# Patient Record
Sex: Male | Born: 1935 | Race: White | Hispanic: No | Marital: Married
Health system: Southern US, Community
[De-identification: ages and names within clinical notes are randomized; demographics above are authoritative.]

---

## 2004-09-19 ENCOUNTER — Inpatient Hospital Stay: Payer: Self-pay | Admitting: Internal Medicine

## 2004-09-19 ENCOUNTER — Other Ambulatory Visit: Payer: Self-pay

## 2004-09-20 ENCOUNTER — Other Ambulatory Visit: Payer: Self-pay

## 2004-09-21 ENCOUNTER — Other Ambulatory Visit: Payer: Self-pay

## 2005-02-04 ENCOUNTER — Emergency Department: Payer: Self-pay | Admitting: Emergency Medicine

## 2005-05-02 ENCOUNTER — Other Ambulatory Visit: Payer: Self-pay

## 2005-05-03 ENCOUNTER — Other Ambulatory Visit: Payer: Self-pay

## 2005-05-03 ENCOUNTER — Inpatient Hospital Stay: Payer: Self-pay | Admitting: Internal Medicine

## 2005-05-04 ENCOUNTER — Other Ambulatory Visit: Payer: Self-pay

## 2005-06-12 ENCOUNTER — Encounter: Payer: Self-pay | Admitting: Orthopedic Surgery

## 2005-06-23 ENCOUNTER — Encounter: Payer: Self-pay | Admitting: Orthopedic Surgery

## 2007-08-13 ENCOUNTER — Ambulatory Visit: Payer: Self-pay | Admitting: Family Medicine

## 2007-09-23 ENCOUNTER — Ambulatory Visit: Payer: Self-pay | Admitting: Family Medicine

## 2007-09-24 ENCOUNTER — Ambulatory Visit: Payer: Self-pay | Admitting: Family Medicine

## 2008-09-28 ENCOUNTER — Ambulatory Visit: Payer: Self-pay | Admitting: Family Medicine

## 2009-01-28 ENCOUNTER — Emergency Department: Payer: Self-pay | Admitting: Emergency Medicine

## 2009-12-14 ENCOUNTER — Ambulatory Visit: Payer: Self-pay | Admitting: Internal Medicine

## 2010-03-22 ENCOUNTER — Ambulatory Visit: Payer: Self-pay | Admitting: Family Medicine

## 2010-04-06 ENCOUNTER — Ambulatory Visit: Payer: Self-pay | Admitting: Family Medicine

## 2010-10-21 ENCOUNTER — Ambulatory Visit: Payer: Self-pay | Admitting: Family Medicine

## 2010-10-23 ENCOUNTER — Emergency Department: Payer: Self-pay | Admitting: Emergency Medicine

## 2011-01-28 ENCOUNTER — Ambulatory Visit: Payer: Self-pay | Admitting: Family Medicine

## 2011-04-12 ENCOUNTER — Ambulatory Visit: Payer: Self-pay | Admitting: Family Medicine

## 2011-04-15 ENCOUNTER — Inpatient Hospital Stay: Payer: Self-pay | Admitting: Internal Medicine

## 2011-04-30 ENCOUNTER — Emergency Department: Payer: Self-pay | Admitting: Internal Medicine

## 2011-05-02 DIAGNOSIS — E785 Hyperlipidemia, unspecified: Secondary | ICD-10-CM

## 2011-05-02 DIAGNOSIS — F329 Major depressive disorder, single episode, unspecified: Secondary | ICD-10-CM

## 2011-05-02 DIAGNOSIS — F0150 Vascular dementia without behavioral disturbance: Secondary | ICD-10-CM

## 2011-05-02 DIAGNOSIS — I10 Essential (primary) hypertension: Secondary | ICD-10-CM

## 2011-05-09 DIAGNOSIS — IMO0001 Reserved for inherently not codable concepts without codable children: Secondary | ICD-10-CM

## 2011-05-09 DIAGNOSIS — F609 Personality disorder, unspecified: Secondary | ICD-10-CM

## 2011-05-12 DIAGNOSIS — Z9181 History of falling: Secondary | ICD-10-CM

## 2011-06-05 DIAGNOSIS — M810 Age-related osteoporosis without current pathological fracture: Secondary | ICD-10-CM

## 2011-06-05 DIAGNOSIS — F068 Other specified mental disorders due to known physiological condition: Secondary | ICD-10-CM

## 2011-06-05 DIAGNOSIS — F3289 Other specified depressive episodes: Secondary | ICD-10-CM

## 2011-06-05 DIAGNOSIS — I10 Essential (primary) hypertension: Secondary | ICD-10-CM

## 2011-06-05 DIAGNOSIS — F329 Major depressive disorder, single episode, unspecified: Secondary | ICD-10-CM

## 2011-06-13 DIAGNOSIS — M25579 Pain in unspecified ankle and joints of unspecified foot: Secondary | ICD-10-CM

## 2011-07-20 DIAGNOSIS — F329 Major depressive disorder, single episode, unspecified: Secondary | ICD-10-CM

## 2011-07-20 DIAGNOSIS — I10 Essential (primary) hypertension: Secondary | ICD-10-CM

## 2011-07-20 DIAGNOSIS — M159 Polyosteoarthritis, unspecified: Secondary | ICD-10-CM

## 2011-07-20 DIAGNOSIS — F068 Other specified mental disorders due to known physiological condition: Secondary | ICD-10-CM

## 2011-07-25 DIAGNOSIS — H9190 Unspecified hearing loss, unspecified ear: Secondary | ICD-10-CM

## 2011-07-31 DIAGNOSIS — R4182 Altered mental status, unspecified: Secondary | ICD-10-CM

## 2011-08-01 ENCOUNTER — Ambulatory Visit: Payer: Self-pay | Admitting: Internal Medicine

## 2011-08-08 DIAGNOSIS — S20219A Contusion of unspecified front wall of thorax, initial encounter: Secondary | ICD-10-CM

## 2011-08-11 DIAGNOSIS — G4752 REM sleep behavior disorder: Secondary | ICD-10-CM

## 2011-10-11 IMAGING — CR DG CHEST 2V
1 series · 3 of 3 positions shown · non-contrast
Comparison: none

REASON FOR EXAM: bronchitis, Urbain, cough
COMMENTS:

[Series 1: view not recorded · 0.17mm/px · 3 of 3 slices shown]
[im 1/3]
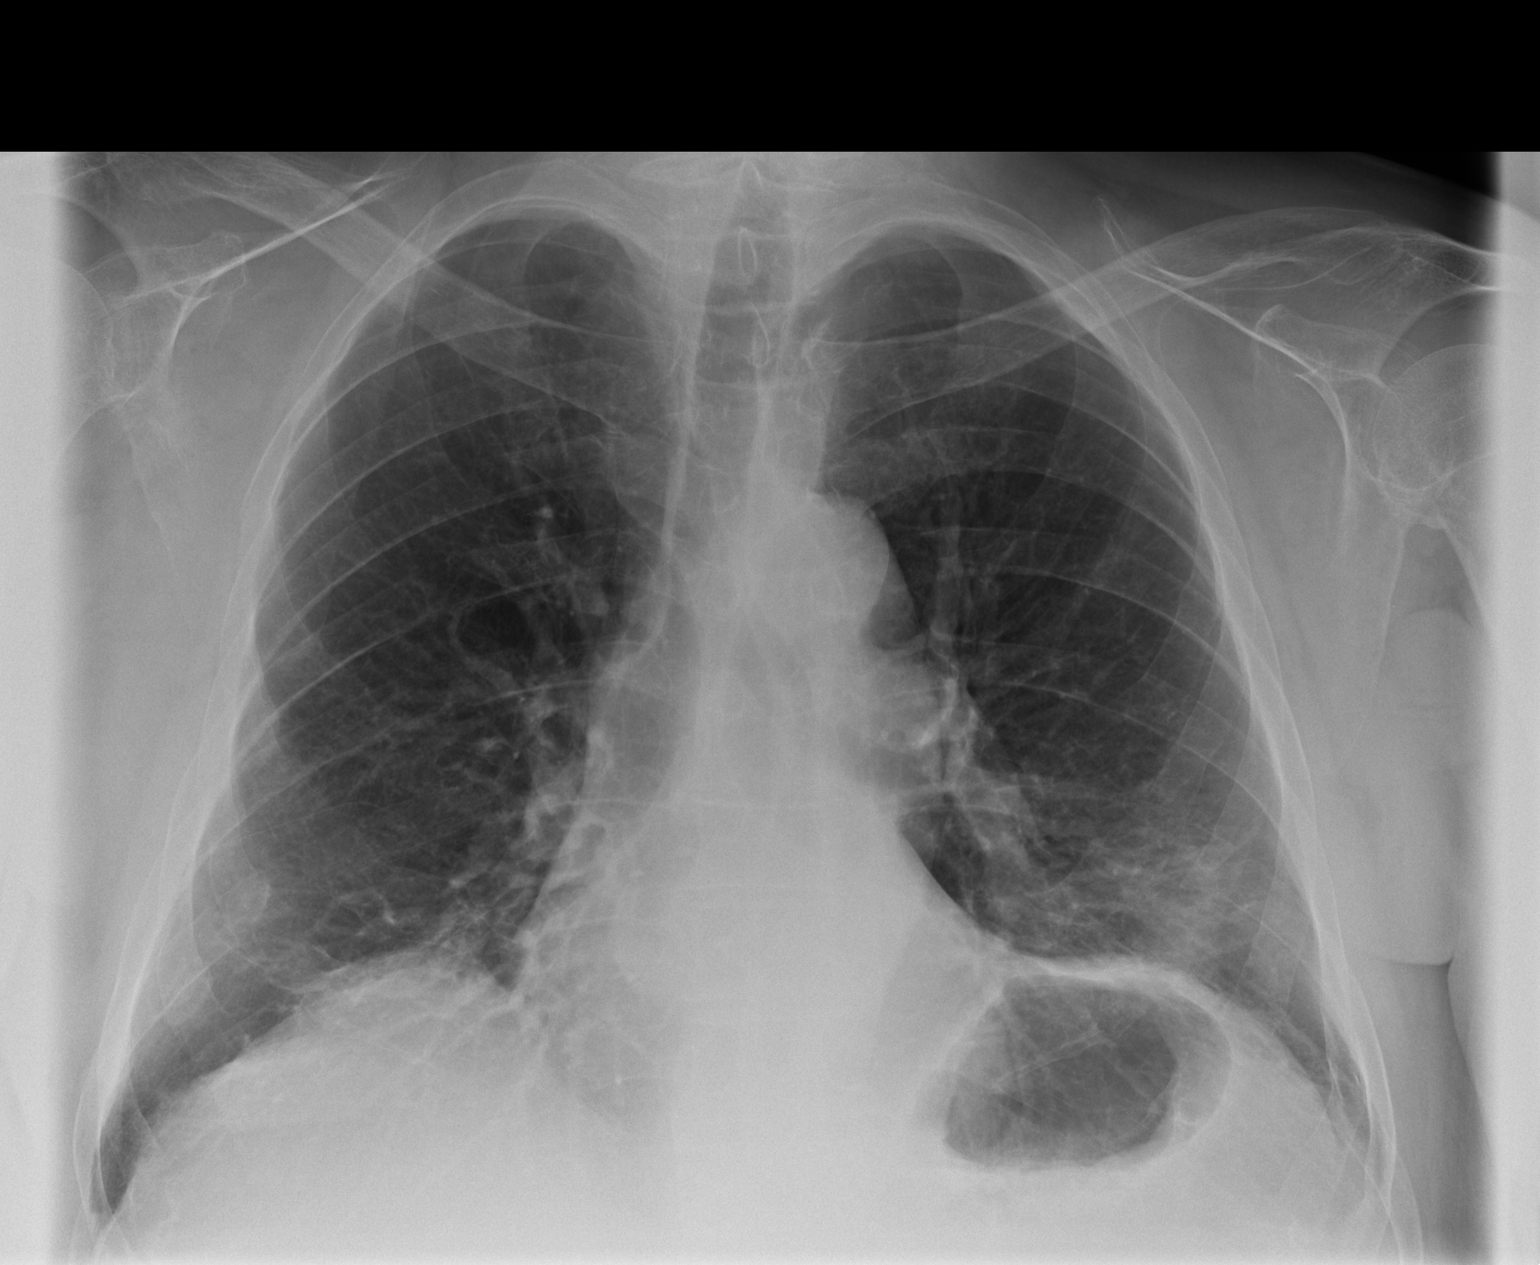
[im 2/3]
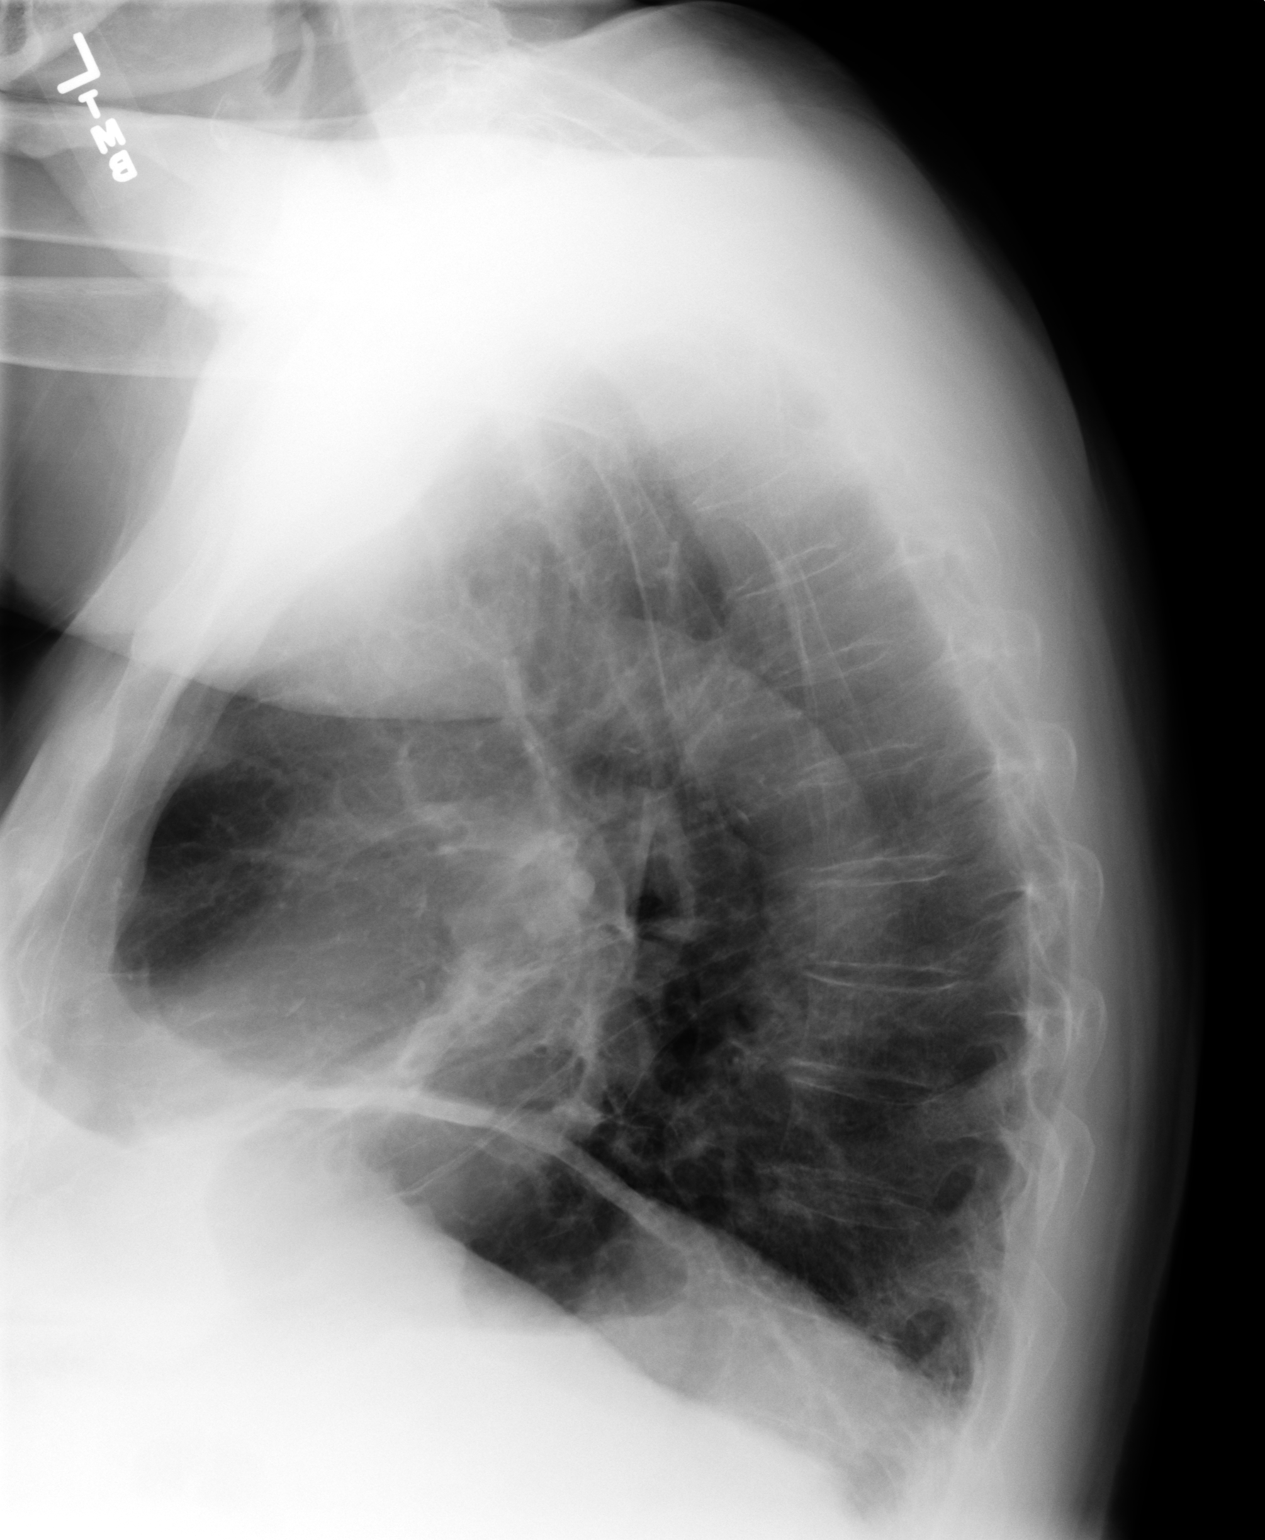
[im 3/3]
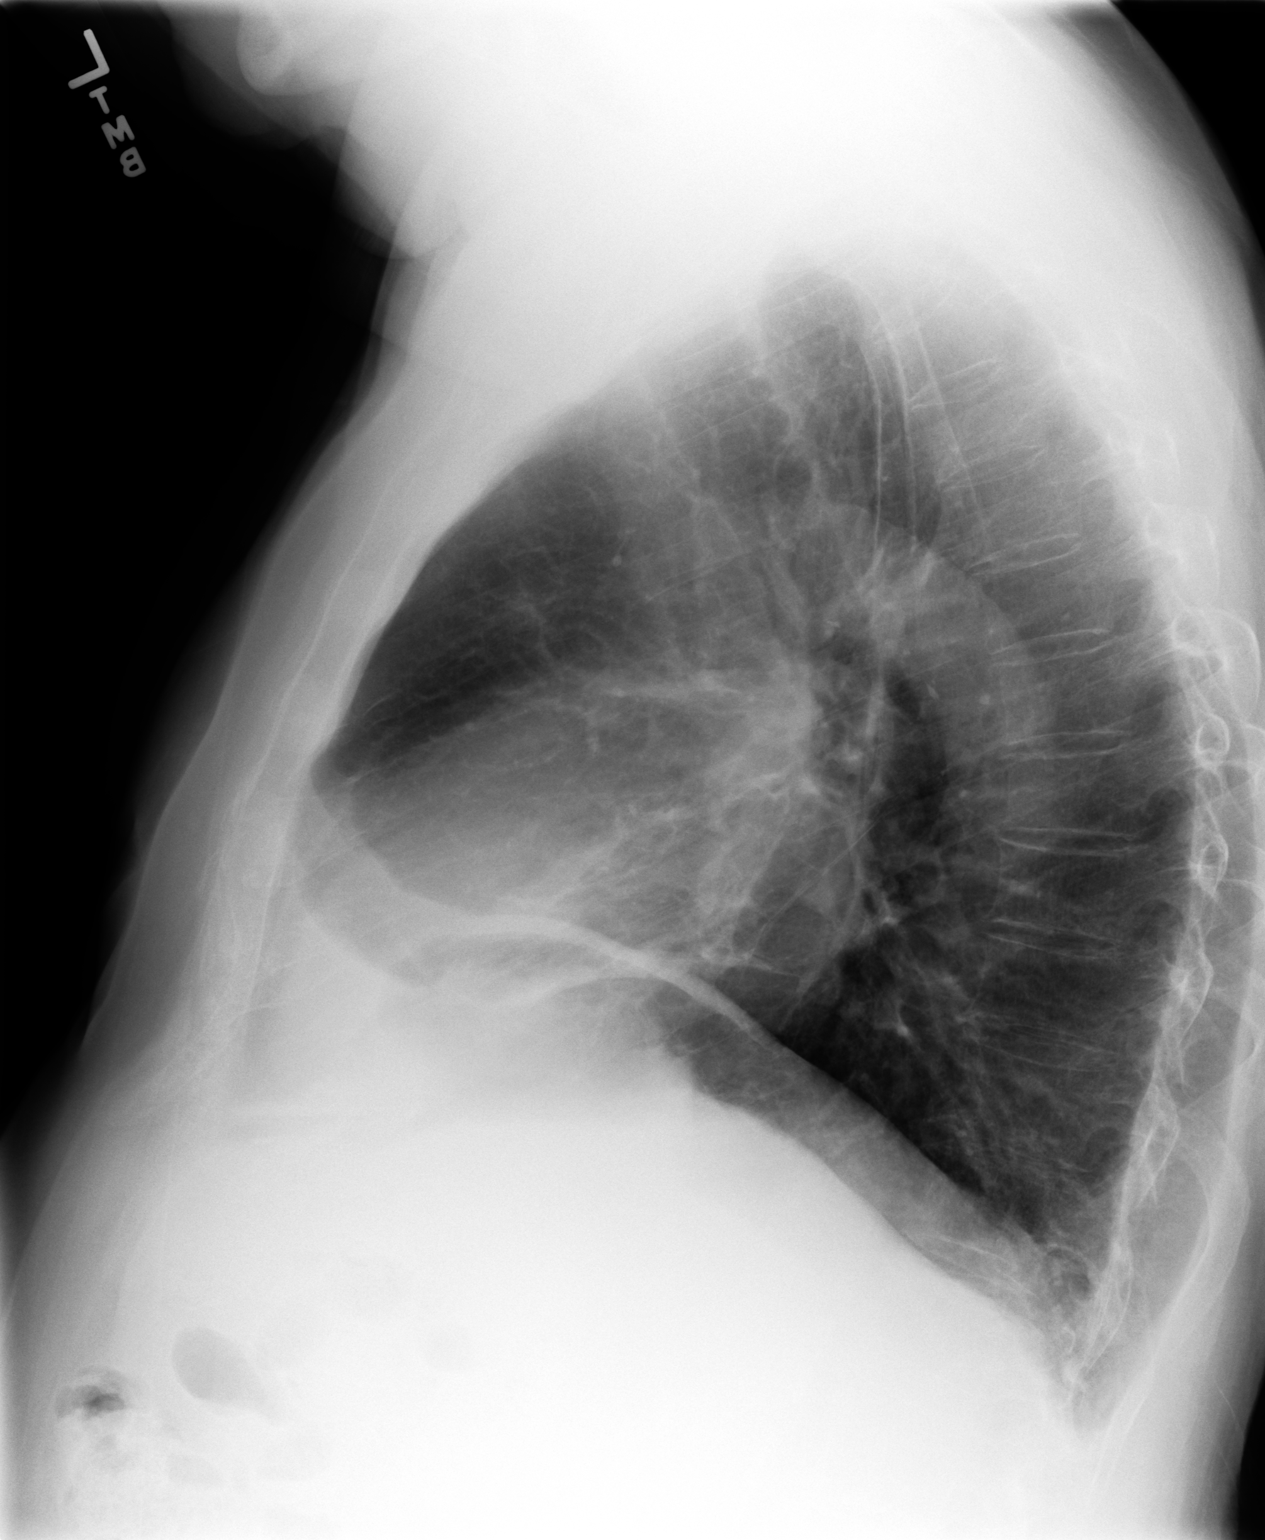

[3 of 3 positions shown; findings below may reference images not displayed]

PROCEDURE:     MDR - MDR CHEST PA(OR AP) AND LATERAL  - March 22, 2010  [DATE]

RESULT:     Comparison is made to the prior exam of 09/23/2007. The current
exam shows a patchy increase in density at the left base. It is uncertain as
to whether the change is secondary to pneumonia, atelectasis or fibrosis.
The lung fields otherwise are clear. Heart size is normal. There are
multiple old rib fracture deformities on the right.
IMPRESSION: 1. There is a patchy area of increased density at the left base. Pneumonia
cannot be excluded but the change also could be secondary to atelectasis or
to fibrosis.
2. Multiple old rib fracture deformities are noted on the right.
3. Heart size is normal.

## 2011-10-20 DIAGNOSIS — Z8673 Personal history of transient ischemic attack (TIA), and cerebral infarction without residual deficits: Secondary | ICD-10-CM

## 2011-10-20 DIAGNOSIS — R413 Other amnesia: Secondary | ICD-10-CM

## 2011-10-20 DIAGNOSIS — M545 Low back pain: Secondary | ICD-10-CM

## 2011-11-13 DIAGNOSIS — IMO0002 Reserved for concepts with insufficient information to code with codable children: Secondary | ICD-10-CM

## 2011-11-17 DIAGNOSIS — F39 Unspecified mood [affective] disorder: Secondary | ICD-10-CM

## 2011-11-24 DIAGNOSIS — I10 Essential (primary) hypertension: Secondary | ICD-10-CM

## 2011-11-24 DIAGNOSIS — S79929A Unspecified injury of unspecified thigh, initial encounter: Secondary | ICD-10-CM

## 2011-11-24 DIAGNOSIS — M818 Other osteoporosis without current pathological fracture: Secondary | ICD-10-CM

## 2011-11-24 DIAGNOSIS — R413 Other amnesia: Secondary | ICD-10-CM

## 2011-11-24 DIAGNOSIS — S79919A Unspecified injury of unspecified hip, initial encounter: Secondary | ICD-10-CM

## 2011-12-11 ENCOUNTER — Emergency Department: Payer: Self-pay | Admitting: Emergency Medicine

## 2011-12-12 ENCOUNTER — Telehealth: Payer: Self-pay | Admitting: Internal Medicine

## 2011-12-12 NOTE — Telephone Encounter (Signed)
Triage Record Num: [id number] Operator: Estevan Oaks Patient Name: Nathan [name] Call Date & Time: 12/11/2011 7:28:38PM Patient Phone: [phone] PCP: Tillman Abide Patient Gender: Male PCP Fax : [phone] Patient DOB: 1923-04-17 Practice Name[facility] Reason for Call: Caller: Deloris/LPN; PCP: Tillman Abide I.; CB#: [phone]; Call regarding need to send to ED for fall. Fell when he went to bathroom. Having shoulder pain. He insists on going to ED. No swelling, no bruising. They spoke with his daughter and she also asks to send him for xray of shoulder. No mobile xray capability at this facility Deloris says. Shoulder Injury, all ER sx's r/o. To Ed due to pain and pt request. Protocol(s) Used: Shoulder Injury Recommended Outcome per Protocol: See ED Immediately Reason for Outcome: Unbearable pain Care Advice: ~ 12/11/2011 7:33:42PM Page 1 of 1 CAN_TriageRpt_V2

## 2011-12-12 NOTE — Telephone Encounter (Signed)
Will follow up at [facility] today 

## 2012-01-15 DIAGNOSIS — M19019 Primary osteoarthritis, unspecified shoulder: Secondary | ICD-10-CM

## 2012-02-15 DIAGNOSIS — F068 Other specified mental disorders due to known physiological condition: Secondary | ICD-10-CM

## 2012-02-15 DIAGNOSIS — IMO0002 Reserved for concepts with insufficient information to code with codable children: Secondary | ICD-10-CM

## 2012-02-15 DIAGNOSIS — F329 Major depressive disorder, single episode, unspecified: Secondary | ICD-10-CM

## 2012-02-15 DIAGNOSIS — M159 Polyosteoarthritis, unspecified: Secondary | ICD-10-CM

## 2012-02-19 ENCOUNTER — Telehealth: Payer: Self-pay | Admitting: Internal Medicine

## 2012-02-19 ENCOUNTER — Inpatient Hospital Stay: Payer: Self-pay | Admitting: Specialist

## 2012-02-19 LAB — URINALYSIS, COMPLETE
Bacteria: NONE SEEN
Bilirubin,UR: NEGATIVE
Glucose,UR: NEGATIVE mg/dL (ref 0–75)
Ph: 7 (ref 4.5–8.0)
Protein: NEGATIVE
RBC,UR: 105 /HPF (ref 0–5)
Specific Gravity: 1.016 (ref 1.003–1.030)

## 2012-02-19 LAB — CBC
HCT: 37.4 % — ABNORMAL LOW (ref 40.0–52.0)
HGB: 12.5 g/dL — ABNORMAL LOW (ref 13.0–18.0)
MCH: 30.5 pg (ref 26.0–34.0)
MCHC: 33.3 g/dL (ref 32.0–36.0)
MCV: 91 fL (ref 80–100)
Platelet: 189 10*3/uL (ref 150–440)
RDW: 14.4 % (ref 11.5–14.5)

## 2012-02-19 LAB — COMPREHENSIVE METABOLIC PANEL
Anion Gap: 8 (ref 7–16)
Bilirubin,Total: 0.4 mg/dL (ref 0.2–1.0)
SGOT(AST): 29 U/L (ref 15–37)
SGPT (ALT): 25 U/L
Sodium: 139 mmol/L (ref 136–145)
Total Protein: 6.5 g/dL (ref 6.4–8.2)

## 2012-02-19 NOTE — Telephone Encounter (Signed)
Was admitted to [facility] 

## 2012-02-19 NOTE — Telephone Encounter (Signed)
Triage Record Num: [id number] Operator: Edgar Frisk Patient Name: Nealy Karapetian Call Date & Time: 02/19/2012 3:09:34AM Patient Phone: [phone] PCP: Tillman Abide Patient Gender: Male PCP Fax : [phone] Patient DOB: 01/08/1923 [facility] Name[facility] Reason for Call: Caller: Tammy/RN; PCP: Tillman Abide I.; CB#: [phone]; Call regarding Hip Pain; Nurse reports pt found on floor , fell on Right hip. Still in floor, pt c/o severe pain, unable to get pt up . Advised 911 Protocol(s) Used: Hip Injury Recommended Outcome per Protocol: Activate EMS 911 Reason for Outcome: Unbearable pain Care Advice: ~ Do not give the patient anything to eat or drink. Write down provider's name. List or place the following in a bag for transport with the patient: current prescription and/or nonprescription medications; alternative treatments, therapies and medications; and street drugs. ~ 02/19/2012 3:13:47AM Page 1 of 1 CAN_TriageRpt_V2

## 2012-02-20 LAB — CBC WITH DIFFERENTIAL/PLATELET
Basophil %: 0.2 %
Eosinophil %: 3 %
HGB: 11.2 g/dL — ABNORMAL LOW (ref 13.0–18.0)
Lymphocyte #: 1.6 10*3/uL (ref 1.0–3.6)
Lymphocyte %: 19.3 %
MCV: 92 fL (ref 80–100)
Monocyte #: 0.3 x10 3/mm (ref 0.2–1.0)
Monocyte %: 3.4 %
Neutrophil #: 6.3 10*3/uL (ref 1.4–6.5)
Neutrophil %: 74.1 %
RDW: 14.3 % (ref 11.5–14.5)
WBC: 8.5 10*3/uL (ref 3.8–10.6)

## 2012-02-20 LAB — BASIC METABOLIC PANEL
Anion Gap: 8 (ref 7–16)
BUN: 20 mg/dL — ABNORMAL HIGH (ref 7–18)
Calcium, Total: 9 mg/dL (ref 8.5–10.1)
Chloride: 105 mmol/L (ref 98–107)
Co2: 24 mmol/L (ref 21–32)
EGFR (African American): >60
Glucose: 100 mg/dL — ABNORMAL HIGH (ref 65–99)
Sodium: 137 mmol/L (ref 136–145)

## 2012-02-26 DIAGNOSIS — N320 Bladder-neck obstruction: Secondary | ICD-10-CM

## 2012-02-26 DIAGNOSIS — S72033A Displaced midcervical fracture of unspecified femur, initial encounter for closed fracture: Secondary | ICD-10-CM

## 2012-03-26 ENCOUNTER — Observation Stay: Payer: Self-pay | Admitting: Internal Medicine

## 2012-03-26 ENCOUNTER — Telehealth: Payer: Self-pay | Admitting: Internal Medicine

## 2012-03-26 LAB — COMPREHENSIVE METABOLIC PANEL
Anion Gap: 10 (ref 7–16)
Bilirubin,Total: 0.3 mg/dL (ref 0.2–1.0)
Calcium, Total: 9.9 mg/dL (ref 8.5–10.1)
EGFR (African American): >60
EGFR (Non-African Amer.): >60
Osmolality: 284 (ref 275–301)
SGOT(AST): 22 U/L (ref 15–37)
SGPT (ALT): 19 U/L
Sodium: 142 mmol/L (ref 136–145)

## 2012-03-26 LAB — CK TOTAL AND CKMB (NOT AT ARMC)
CK, Total: 94 U/L (ref 35–232)
CK-MB: 0.8 ng/mL (ref 0.5–3.6)

## 2012-03-26 LAB — CBC
HCT: 39.4 % — ABNORMAL LOW (ref 40.0–52.0)
HGB: 13.2 g/dL (ref 13.0–18.0)
MCH: 30 pg (ref 26.0–34.0)
MCV: 90 fL (ref 80–100)
Platelet: 202 10*3/uL (ref 150–440)
RBC: 4.39 10*6/uL — ABNORMAL LOW (ref 4.40–5.90)

## 2012-03-26 LAB — TROPONIN I: Troponin-I: <0.02 ng/mL

## 2012-03-26 LAB — PROTIME-INR: Prothrombin Time: 12.6 secs (ref 11.5–14.7)

## 2012-03-26 NOTE — Telephone Encounter (Signed)
Triage Record Num: [id number] Operator: Edgar Frisk Patient Name: Nathan [name] Call Date & Time: 03/26/2012 5:57:09AM Patient Phone: [phone] PCP: Tillman Abide Patient Gender: Male PCP Fax : [phone] Patient DOB: 06/19/1923 Practice Name[facility] Reason for Call: Caller: Marie/LPN; PCP: Tillman Abide I.; CB#: [phone]; Call regarding Chest Pain; Nurse reports pt c/o chest pain and SOB this am . Has called 911 Protocol(s) Used: Office Note Recommended Outcome per Protocol: Information Noted and Sent to Office Reason for Outcome: Caller information to office Care Advice: ~ 03/26/2012 6:01:39AM Page 1 of 1 CAN_TriageRpt_V2

## 2012-03-26 NOTE — Telephone Encounter (Signed)
Will review his status this afternoon when I go to [facility]

## 2012-03-27 DIAGNOSIS — I359 Nonrheumatic aortic valve disorder, unspecified: Secondary | ICD-10-CM

## 2012-03-27 LAB — CBC WITH DIFFERENTIAL/PLATELET
Basophil #: 0 10*3/uL (ref 0.0–0.1)
Eosinophil #: 0.1 10*3/uL (ref 0.0–0.7)
Eosinophil %: 1.8 %
HGB: 11.1 g/dL — ABNORMAL LOW (ref 13.0–18.0)
Lymphocyte #: 2.4 10*3/uL (ref 1.0–3.6)
Lymphocyte %: 30.6 %
MCH: 30.3 pg (ref 26.0–34.0)
MCHC: 33.7 g/dL (ref 32.0–36.0)
Monocyte %: 6 %
RDW: 14 % (ref 11.5–14.5)

## 2012-03-27 LAB — BASIC METABOLIC PANEL
Anion Gap: 9 (ref 7–16)
Co2: 25 mmol/L (ref 21–32)
Creatinine: 0.79 mg/dL (ref 0.60–1.30)
EGFR (African American): >60
Potassium: 3.7 mmol/L (ref 3.5–5.1)
Sodium: 141 mmol/L (ref 136–145)

## 2012-03-27 LAB — LIPID PANEL
Cholesterol: 179 mg/dL (ref 0–200)
Ldl Cholesterol, Calc: 116 mg/dL — ABNORMAL HIGH (ref 0–100)
Triglycerides: 88 mg/dL (ref 0–200)
VLDL Cholesterol, Calc: 18 mg/dL (ref 5–40)

## 2012-03-28 ENCOUNTER — Ambulatory Visit: Payer: Medicare Other | Admitting: Internal Medicine

## 2012-03-28 DIAGNOSIS — IMO0002 Reserved for concepts with insufficient information to code with codable children: Secondary | ICD-10-CM

## 2012-03-28 DIAGNOSIS — I10 Essential (primary) hypertension: Secondary | ICD-10-CM

## 2012-03-28 DIAGNOSIS — R079 Chest pain, unspecified: Secondary | ICD-10-CM

## 2012-04-05 DIAGNOSIS — Z9181 History of falling: Secondary | ICD-10-CM

## 2012-04-05 DIAGNOSIS — I10 Essential (primary) hypertension: Secondary | ICD-10-CM

## 2012-04-05 DIAGNOSIS — R413 Other amnesia: Secondary | ICD-10-CM

## 2012-04-05 DIAGNOSIS — IMO0002 Reserved for concepts with insufficient information to code with codable children: Secondary | ICD-10-CM

## 2012-04-05 DIAGNOSIS — F411 Generalized anxiety disorder: Secondary | ICD-10-CM

## 2012-04-09 DIAGNOSIS — H6120 Impacted cerumen, unspecified ear: Secondary | ICD-10-CM

## 2012-04-17 DIAGNOSIS — I10 Essential (primary) hypertension: Secondary | ICD-10-CM

## 2012-04-23 DIAGNOSIS — I9589 Other hypotension: Secondary | ICD-10-CM

## 2012-04-24 LAB — COMPREHENSIVE METABOLIC PANEL
Alkaline Phosphatase: 144 U/L — ABNORMAL HIGH (ref 50–136)
Anion Gap: 8 (ref 7–16)
Calcium, Total: 9.8 mg/dL (ref 8.5–10.1)
Co2: 26 mmol/L (ref 21–32)
Osmolality: 275 (ref 275–301)
Sodium: 135 mmol/L — ABNORMAL LOW (ref 136–145)

## 2012-04-24 LAB — URINALYSIS, COMPLETE
Bacteria: NONE SEEN
Bilirubin,UR: NEGATIVE
Blood: NEGATIVE
Glucose,UR: NEGATIVE mg/dL (ref 0–75)
Ph: 5 (ref 4.5–8.0)
Specific Gravity: 1.027 (ref 1.003–1.030)
Squamous Epithelial: <1

## 2012-04-24 LAB — CBC
HCT: 34.5 % — ABNORMAL LOW (ref 40.0–52.0)
MCH: 29.3 pg (ref 26.0–34.0)
MCV: 89 fL (ref 80–100)
Platelet: 189 10*3/uL (ref 150–440)
RBC: 3.89 10*6/uL — ABNORMAL LOW (ref 4.40–5.90)
WBC: 14.7 10*3/uL — ABNORMAL HIGH (ref 3.8–10.6)

## 2012-04-25 ENCOUNTER — Inpatient Hospital Stay: Payer: Self-pay | Admitting: Internal Medicine

## 2012-04-25 DIAGNOSIS — R55 Syncope and collapse: Secondary | ICD-10-CM

## 2012-04-25 LAB — TROPONIN I: Troponin-I: 0.49 ng/mL — ABNORMAL HIGH

## 2012-04-26 ENCOUNTER — Telehealth: Payer: Self-pay | Admitting: Internal Medicine

## 2012-04-26 ENCOUNTER — Emergency Department: Payer: Self-pay | Admitting: *Deleted

## 2012-04-26 LAB — LIPID PANEL
Cholesterol: 129 mg/dL (ref 0–200)
Ldl Cholesterol, Calc: 69 mg/dL (ref 0–100)
Triglycerides: 71 mg/dL (ref 0–200)
VLDL Cholesterol, Calc: 14 mg/dL (ref 5–40)

## 2012-04-26 NOTE — Telephone Encounter (Signed)
Triage Record Num: [id number] Operator: Tomasita Crumble Patient Name: Nathan [name] Call Date & Time: 04/24/2012 8:30:52PM Patient Phone: [phone] PCP: Tillman Abide Patient Gender: Male PCP Fax : [phone] Patient DOB: 06-14-1923 [facility] Name[facility] Reason for Call: Caller: Dee/LPN from [facility]. ; PCP: Tillman Abide; CB#: [phone]; Call regarding Possible stroke; Hx of CVA. 90/60-76-18; afebrile/ slurred speech, other neuro deficits. Pt is DNR, but spouse asked for him to be sent to ED; 911 has been called. Pt was unresponsive for minimun of 60 seconds. Advised 911 per Neurological Deficits protocol. Protocol(s) Used: Neurological Deficits Recommended Outcome per Protocol: Activate EMS 911 Reason for Outcome: New or worsening signs and symptoms that may indicate shock Care Advice: ~ 04/24/2012 8:35:44PM Page 1 of 1 CAN_TriageRpt_V2

## 2012-04-27 NOTE — Telephone Encounter (Signed)
Already coming back to [facility] Will review his status on July 8th

## 2012-04-29 ENCOUNTER — Telehealth: Payer: Self-pay | Admitting: Internal Medicine

## 2012-04-29 DIAGNOSIS — F068 Other specified mental disorders due to known physiological condition: Secondary | ICD-10-CM

## 2012-04-29 DIAGNOSIS — IMO0002 Reserved for concepts with insufficient information to code with codable children: Secondary | ICD-10-CM

## 2012-04-29 DIAGNOSIS — R55 Syncope and collapse: Secondary | ICD-10-CM

## 2012-04-29 NOTE — Telephone Encounter (Signed)
Seen today at [facility] 

## 2012-04-29 NOTE — Telephone Encounter (Signed)
Triage Record Num: [id number] Operator: Alphonsa Overall Patient Name: Nathan [name] Call Date & Time: 04/26/2012 8:34:57PM Patient Phone: [phone] PCP: Tillman Abide Patient Gender: Male PCP Fax : [phone] Patient DOB: 1923/06/18 Practice Name[facility] Reason for Call: Caller: Dee/LPN; PCP: Tillman Abide; CB#: [phone]; Call regarding Unresponsive, confused [facility] LPN calling about unresponsiveness 04/26/12. Readmitted 04/26/12 from hosp for low BP and pulse. BP dropping 70/60 and not able to respond and confused. Pt spouse aware and had pt sent to ED. Protocol(s) Used: Confusion, Disorientation, Agitation Recommended Outcome per Protocol: Activate EMS 911 Reason for Outcome: New or worsening neuro deficits AND new or worsening confusion, disorientation or agitation Care Advice: ~ IMMEDIATE ACTION 04/26/2012 8:40:09PM Page 1 of 1 CAN_TriageRpt_V2

## 2012-05-10 DIAGNOSIS — R609 Edema, unspecified: Secondary | ICD-10-CM

## 2012-05-15 DIAGNOSIS — B354 Tinea corporis: Secondary | ICD-10-CM

## 2012-06-18 DIAGNOSIS — F329 Major depressive disorder, single episode, unspecified: Secondary | ICD-10-CM

## 2012-06-18 DIAGNOSIS — I10 Essential (primary) hypertension: Secondary | ICD-10-CM

## 2012-06-18 DIAGNOSIS — F068 Other specified mental disorders due to known physiological condition: Secondary | ICD-10-CM

## 2012-06-18 DIAGNOSIS — M159 Polyosteoarthritis, unspecified: Secondary | ICD-10-CM

## 2012-08-01 DIAGNOSIS — F068 Other specified mental disorders due to known physiological condition: Secondary | ICD-10-CM

## 2012-08-01 DIAGNOSIS — F411 Generalized anxiety disorder: Secondary | ICD-10-CM

## 2012-08-09 DIAGNOSIS — F068 Other specified mental disorders due to known physiological condition: Secondary | ICD-10-CM

## 2012-08-13 DIAGNOSIS — IMO0002 Reserved for concepts with insufficient information to code with codable children: Secondary | ICD-10-CM

## 2012-08-14 DIAGNOSIS — F411 Generalized anxiety disorder: Secondary | ICD-10-CM

## 2012-08-14 DIAGNOSIS — F068 Other specified mental disorders due to known physiological condition: Secondary | ICD-10-CM

## 2012-08-29 DIAGNOSIS — R030 Elevated blood-pressure reading, without diagnosis of hypertension: Secondary | ICD-10-CM

## 2012-09-03 DIAGNOSIS — L570 Actinic keratosis: Secondary | ICD-10-CM

## 2012-10-10 DIAGNOSIS — IMO0002 Reserved for concepts with insufficient information to code with codable children: Secondary | ICD-10-CM

## 2012-10-10 DIAGNOSIS — F329 Major depressive disorder, single episode, unspecified: Secondary | ICD-10-CM

## 2012-10-10 DIAGNOSIS — F068 Other specified mental disorders due to known physiological condition: Secondary | ICD-10-CM

## 2012-10-10 DIAGNOSIS — I10 Essential (primary) hypertension: Secondary | ICD-10-CM

## 2012-10-14 ENCOUNTER — Other Ambulatory Visit: Payer: Self-pay | Admitting: Internal Medicine

## 2012-10-14 NOTE — Telephone Encounter (Signed)
rx called into pharmacy

## 2012-10-14 NOTE — Telephone Encounter (Signed)
Okay #30 x 5 This is a [facility] skilled nursing patient

## 2012-12-13 DIAGNOSIS — I259 Chronic ischemic heart disease, unspecified: Secondary | ICD-10-CM

## 2012-12-13 DIAGNOSIS — F411 Generalized anxiety disorder: Secondary | ICD-10-CM

## 2012-12-13 DIAGNOSIS — H3530 Unspecified macular degeneration: Secondary | ICD-10-CM

## 2012-12-13 DIAGNOSIS — F068 Other specified mental disorders due to known physiological condition: Secondary | ICD-10-CM

## 2012-12-13 DIAGNOSIS — I471 Supraventricular tachycardia: Secondary | ICD-10-CM

## 2013-02-14 DIAGNOSIS — G4733 Obstructive sleep apnea (adult) (pediatric): Secondary | ICD-10-CM

## 2013-02-14 DIAGNOSIS — I959 Hypotension, unspecified: Secondary | ICD-10-CM

## 2013-02-14 DIAGNOSIS — F068 Other specified mental disorders due to known physiological condition: Secondary | ICD-10-CM

## 2013-02-14 DIAGNOSIS — F411 Generalized anxiety disorder: Secondary | ICD-10-CM

## 2013-02-14 DIAGNOSIS — R55 Syncope and collapse: Secondary | ICD-10-CM

## 2013-04-21 DIAGNOSIS — IMO0002 Reserved for concepts with insufficient information to code with codable children: Secondary | ICD-10-CM

## 2013-04-21 DIAGNOSIS — F22 Delusional disorders: Secondary | ICD-10-CM

## 2013-04-21 DIAGNOSIS — F329 Major depressive disorder, single episode, unspecified: Secondary | ICD-10-CM

## 2013-04-21 DIAGNOSIS — I10 Essential (primary) hypertension: Secondary | ICD-10-CM

## 2013-04-21 DIAGNOSIS — F0390 Unspecified dementia without behavioral disturbance: Secondary | ICD-10-CM

## 2013-06-18 DIAGNOSIS — F068 Other specified mental disorders due to known physiological condition: Secondary | ICD-10-CM

## 2013-06-18 DIAGNOSIS — F411 Generalized anxiety disorder: Secondary | ICD-10-CM

## 2013-06-18 DIAGNOSIS — I959 Hypotension, unspecified: Secondary | ICD-10-CM

## 2013-06-18 DIAGNOSIS — I359 Nonrheumatic aortic valve disorder, unspecified: Secondary | ICD-10-CM

## 2013-06-18 DIAGNOSIS — I259 Chronic ischemic heart disease, unspecified: Secondary | ICD-10-CM

## 2013-08-14 DIAGNOSIS — F0390 Unspecified dementia without behavioral disturbance: Secondary | ICD-10-CM

## 2013-08-14 DIAGNOSIS — I10 Essential (primary) hypertension: Secondary | ICD-10-CM

## 2013-08-14 DIAGNOSIS — F22 Delusional disorders: Secondary | ICD-10-CM

## 2013-08-14 DIAGNOSIS — F3289 Other specified depressive episodes: Secondary | ICD-10-CM

## 2013-08-14 DIAGNOSIS — F0392 Unspecified dementia, unspecified severity, with psychotic disturbance: Secondary | ICD-10-CM

## 2013-08-14 DIAGNOSIS — F329 Major depressive disorder, single episode, unspecified: Secondary | ICD-10-CM

## 2013-08-14 DIAGNOSIS — IMO0002 Reserved for concepts with insufficient information to code with codable children: Secondary | ICD-10-CM

## 2013-08-19 DIAGNOSIS — J189 Pneumonia, unspecified organism: Secondary | ICD-10-CM

## 2013-08-20 DIAGNOSIS — R509 Fever, unspecified: Secondary | ICD-10-CM

## 2013-09-08 ENCOUNTER — Ambulatory Visit: Payer: Self-pay | Admitting: Podiatry

## 2013-09-09 IMAGING — CR DG HIP COMPLETE 2+V*L*
1 series · 3 of 3 positions shown · non-contrast
Comparison: none

REASON FOR EXAM: FALL, L HIP PAIN
COMMENTS:   May transport without cardiac monitor

PROCEDURE:     DXR - DXR HIP LEFT COMPLETE  - February 19, 2012  [DATE]
RESULT:     There is a minimally displaced impaction-type fracture of the
subcapital portion of the left femoral neck. No other fractures are seen.

[Series 1: t hip ap left · 0.14mm/px · 3 of 3 slices shown]
[im 1/3]
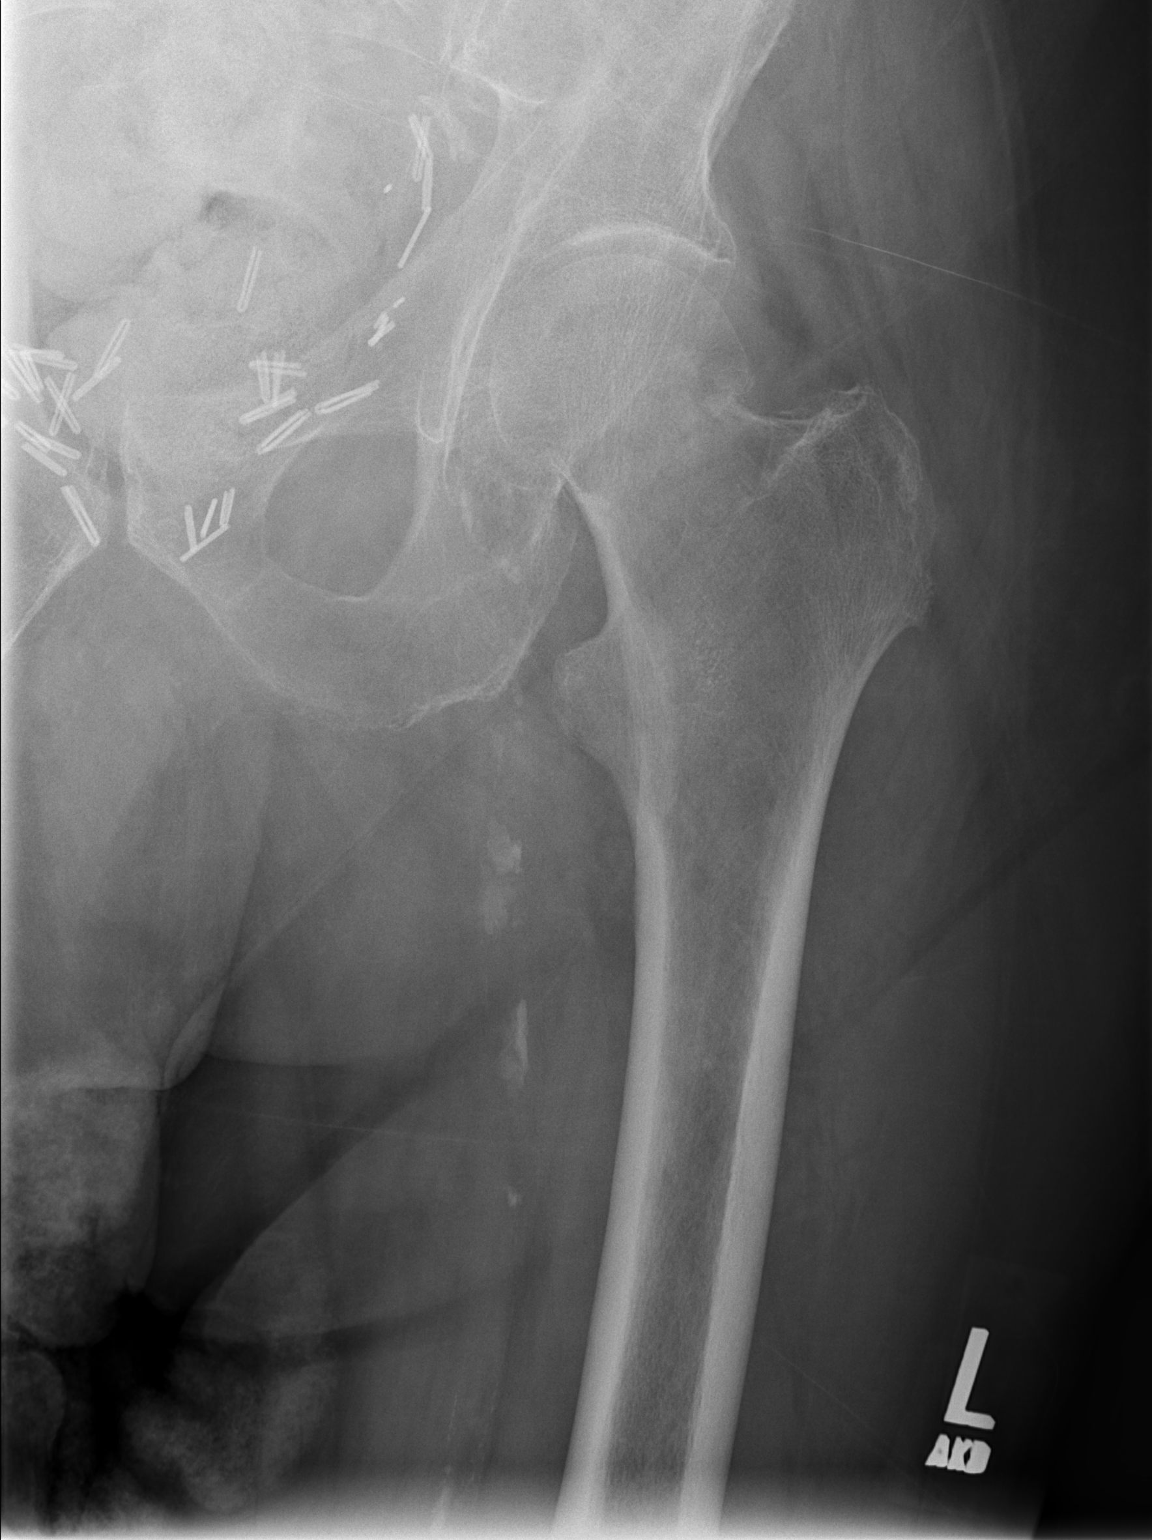
[im 2/3]
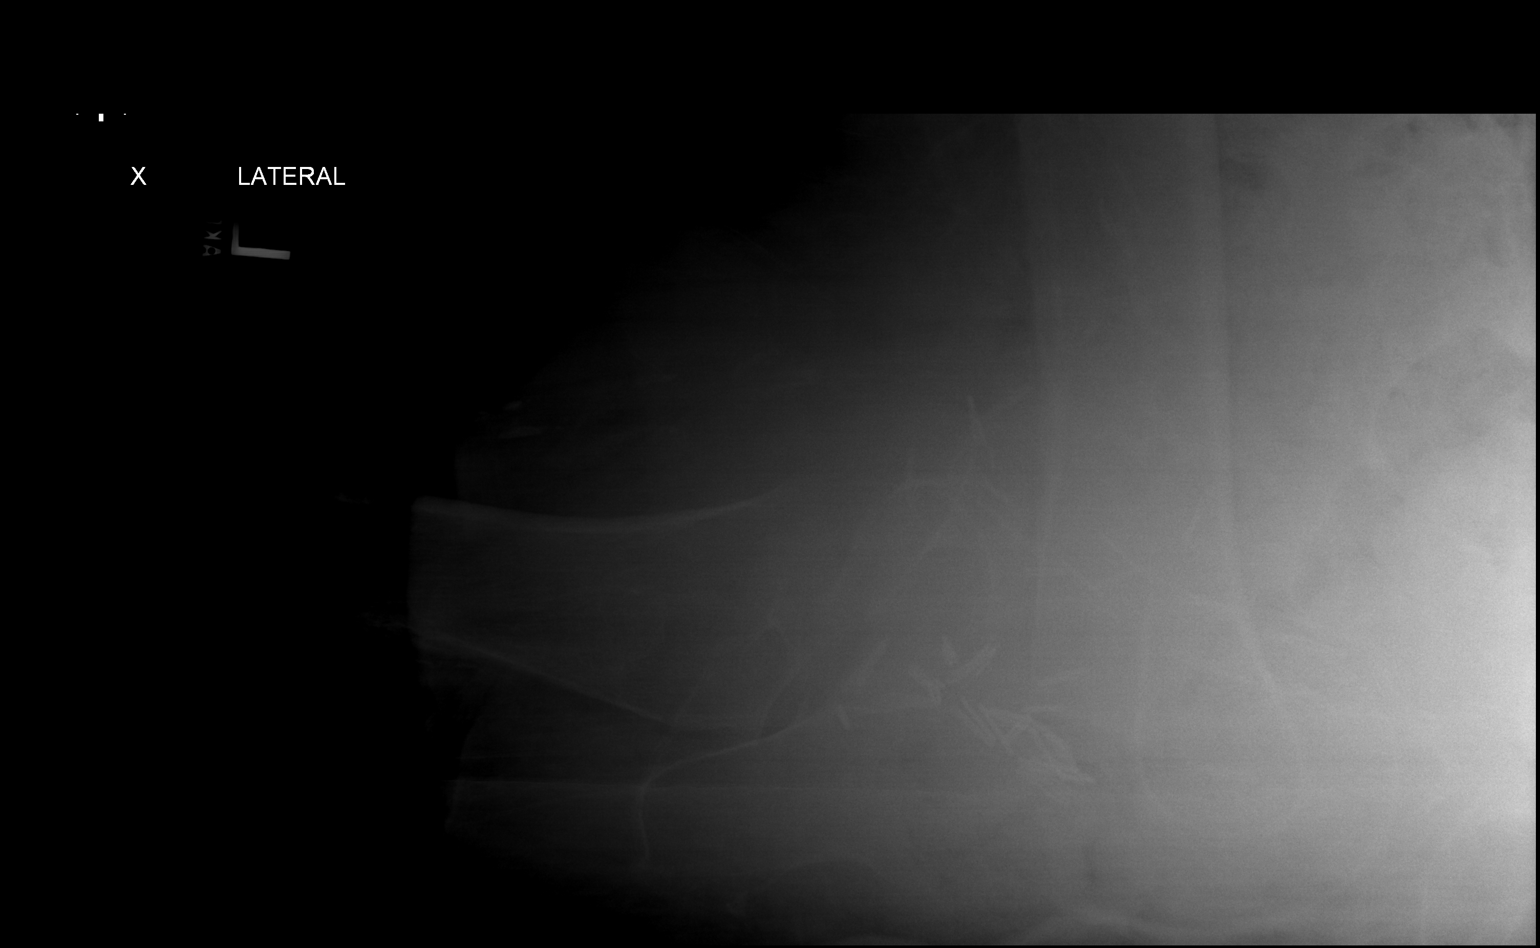
[im 3/3]
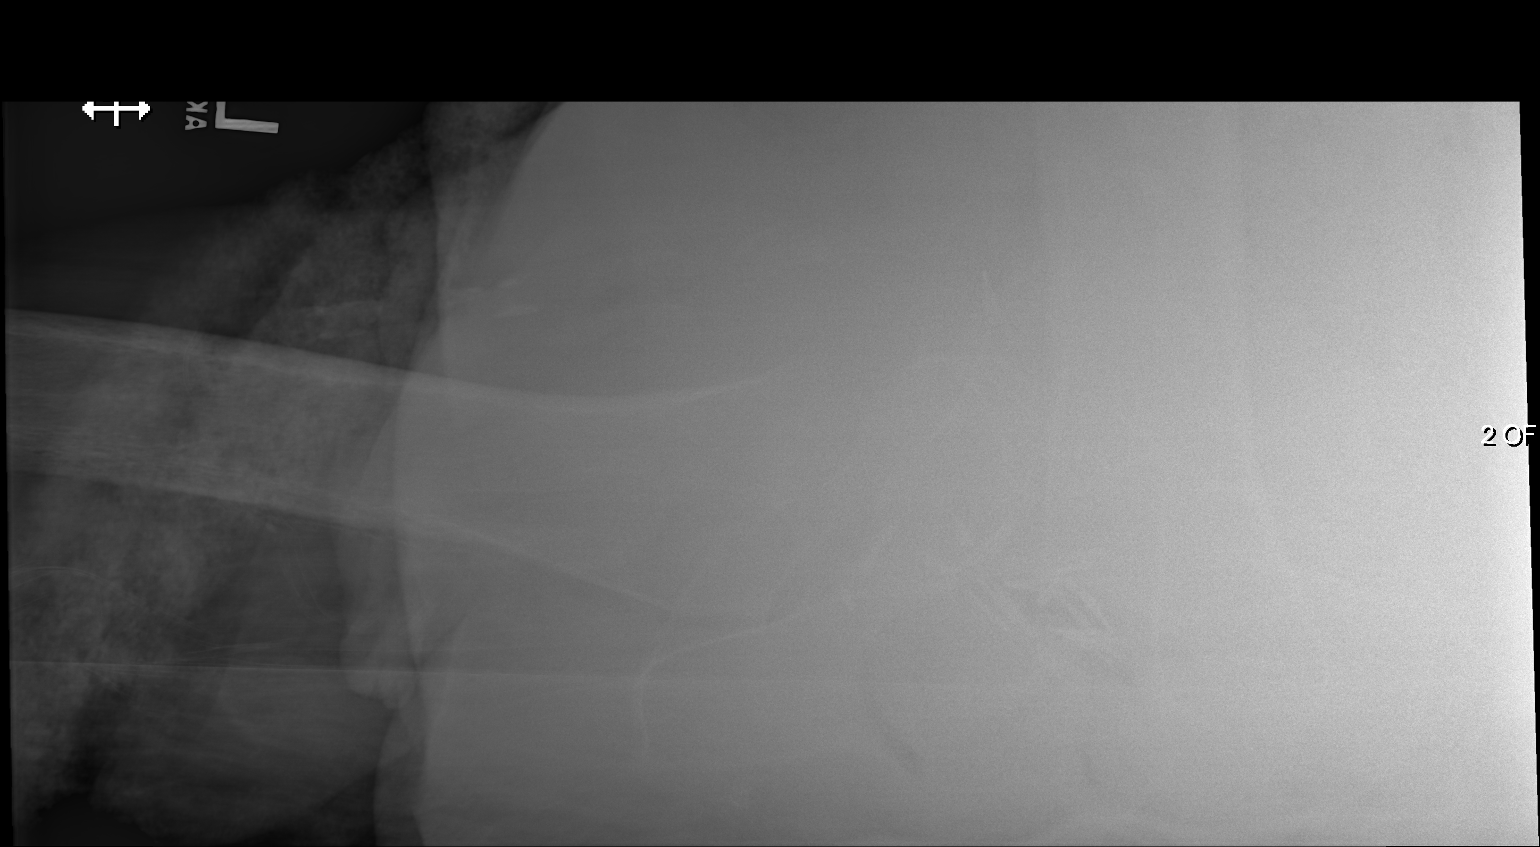

[3 of 3 positions shown; findings below may reference images not displayed]

IMPRESSION: Fracture of the left femoral neck.

## 2013-10-07 DIAGNOSIS — M1710 Unilateral primary osteoarthritis, unspecified knee: Secondary | ICD-10-CM

## 2013-10-07 DIAGNOSIS — F329 Major depressive disorder, single episode, unspecified: Secondary | ICD-10-CM

## 2013-10-07 DIAGNOSIS — E782 Mixed hyperlipidemia: Secondary | ICD-10-CM

## 2013-10-07 DIAGNOSIS — IMO0002 Reserved for concepts with insufficient information to code with codable children: Secondary | ICD-10-CM

## 2013-10-07 DIAGNOSIS — I10 Essential (primary) hypertension: Secondary | ICD-10-CM

## 2013-10-07 DIAGNOSIS — F0390 Unspecified dementia without behavioral disturbance: Secondary | ICD-10-CM

## 2013-11-13 IMAGING — CR DG CHEST 2V
1 series · 3 of 3 positions shown · non-contrast
Comparison: none

REASON FOR EXAM: ams
COMMENTS:

[Series 1: x chest ap · 0.14mm/px · 3 of 3 slices shown]
[im 1/3]
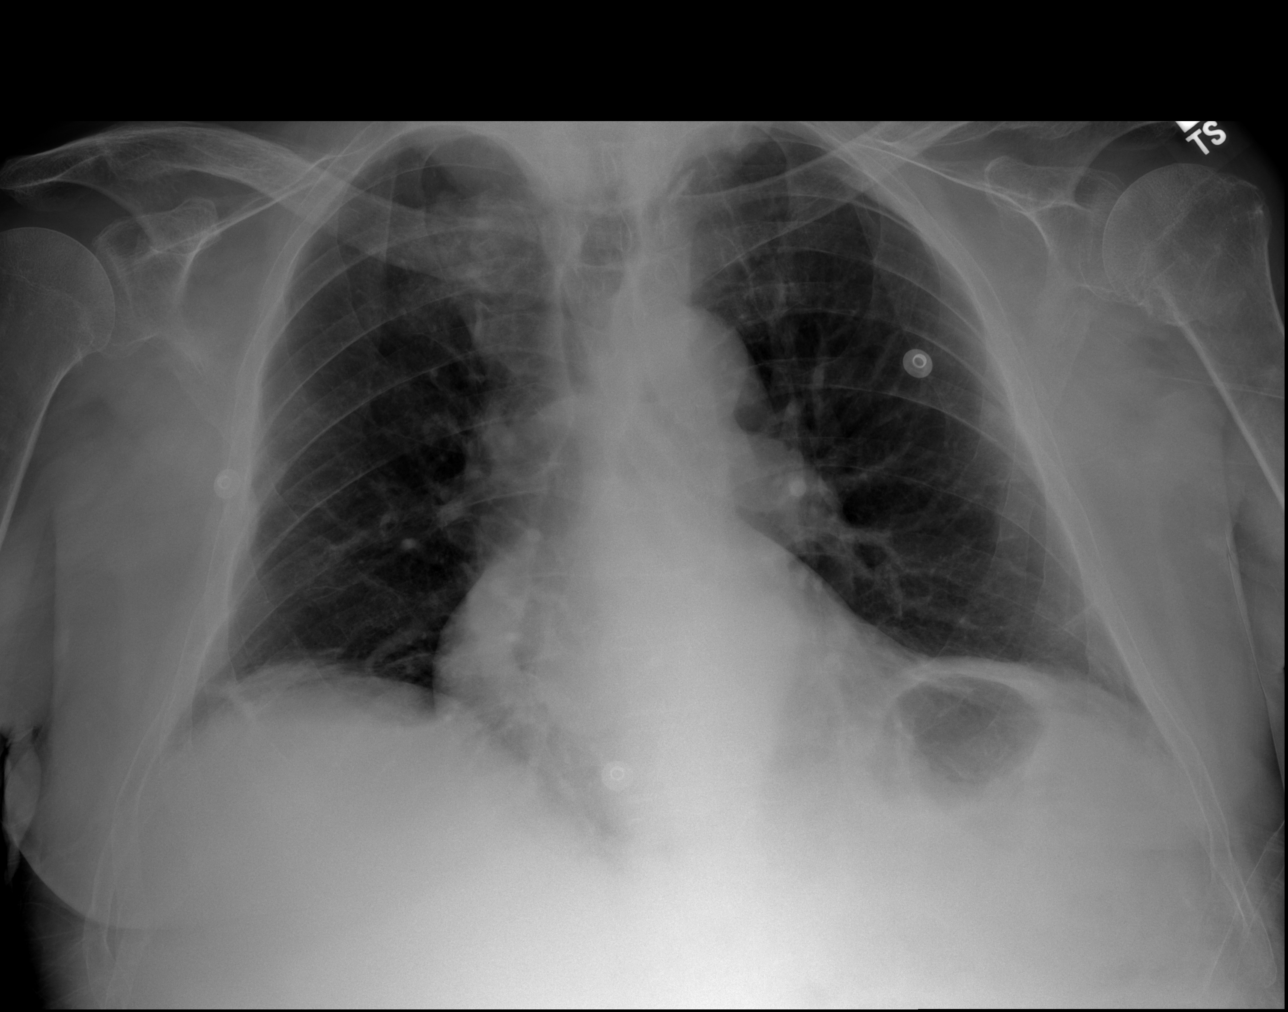
[im 2/3]
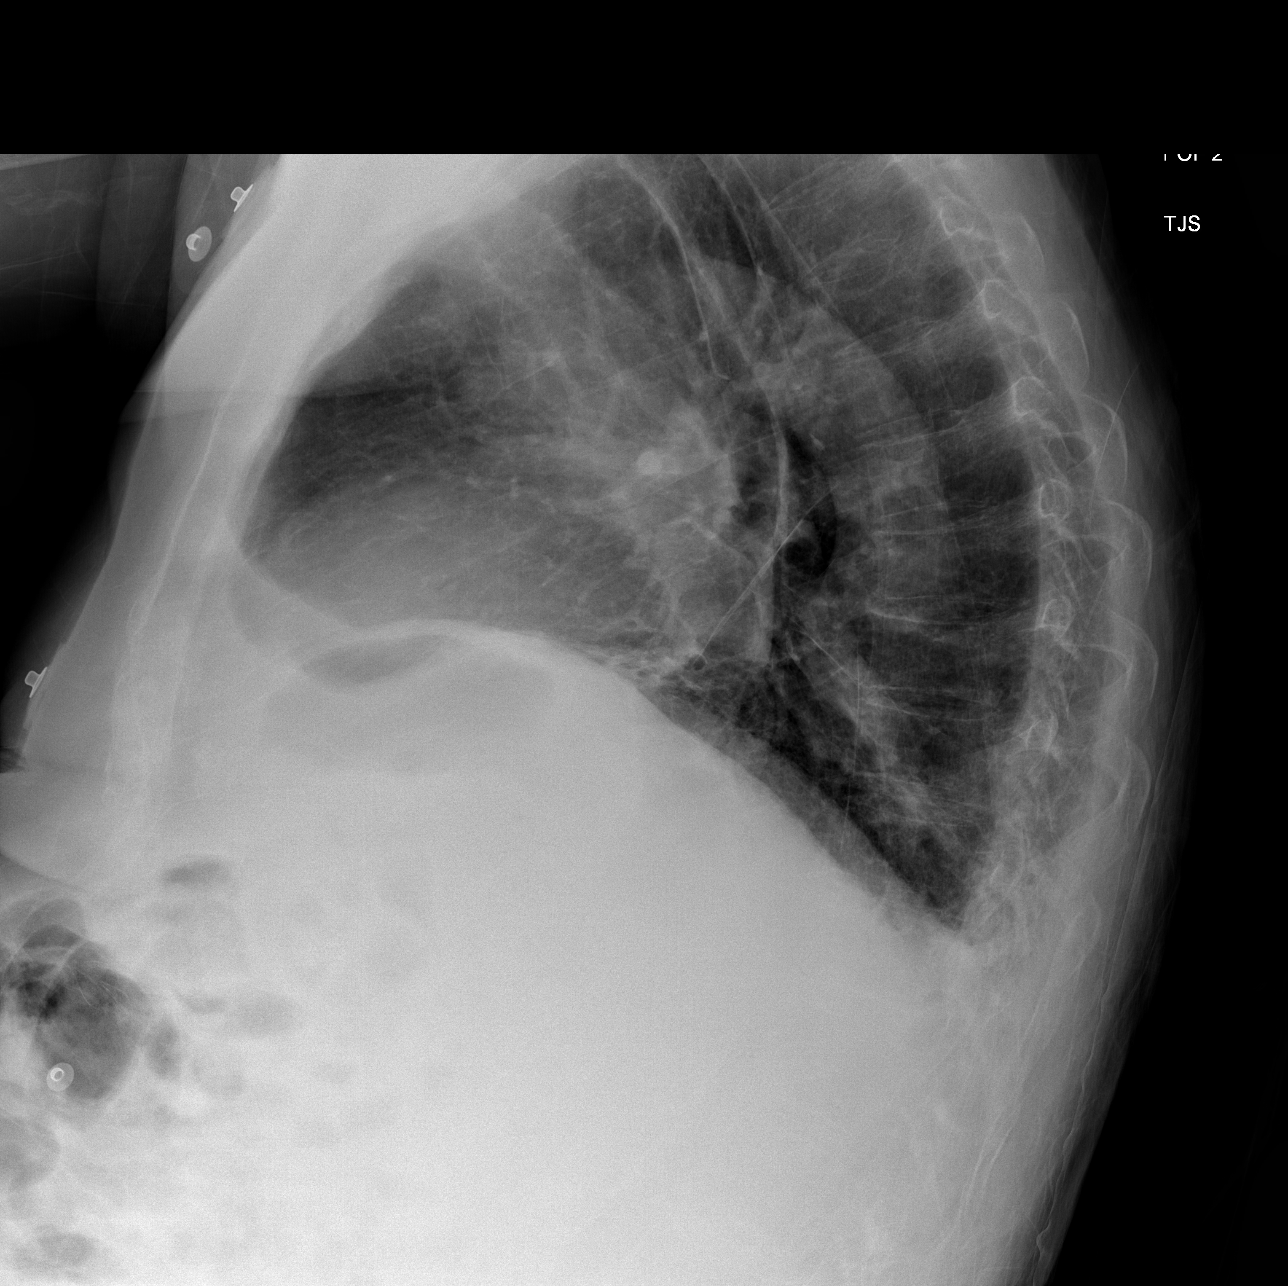
[im 3/3]
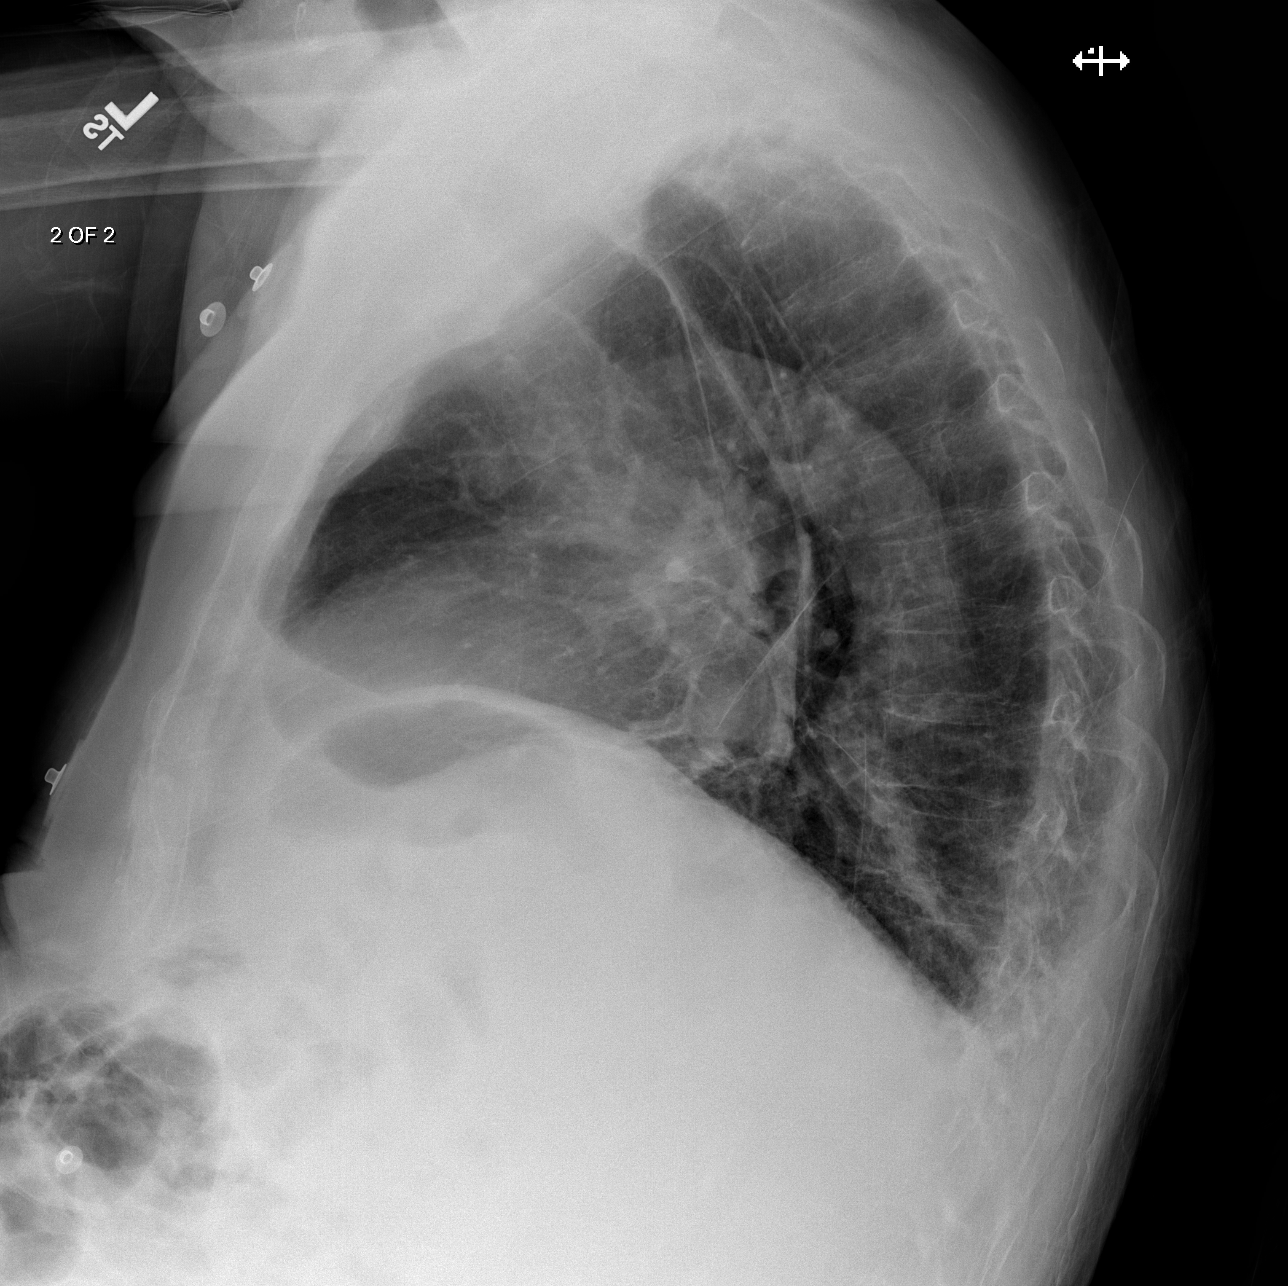

[3 of 3 positions shown; findings below may reference images not displayed]

PROCEDURE:     DXR - DXR CHEST PA (OR AP) AND LATERAL  - April 24, 2012 [DATE]

RESULT:     Comparison is made to a study March 26, 2012.

The lungs are reasonably well inflated. There is no focal infiltrate. The
cardiac silhouette is top normal in size. The pulmonary vascularity is not
engorged. There is no significant pleural fluid collection. I cannot exclude
basilar atelectasis on the right.
IMPRESSION: There is no evidence of focal pneumonia. There may be right
basilar atelectasis. There is no overt evidence of CHF. A followup PA and
lateral chest x-ray with deep inspiration would be useful if the patient has
any acute cardiopulmonary symptoms.

## 2013-12-12 DIAGNOSIS — G4733 Obstructive sleep apnea (adult) (pediatric): Secondary | ICD-10-CM

## 2013-12-12 DIAGNOSIS — I959 Hypotension, unspecified: Secondary | ICD-10-CM

## 2013-12-12 DIAGNOSIS — F068 Other specified mental disorders due to known physiological condition: Secondary | ICD-10-CM

## 2013-12-12 DIAGNOSIS — R55 Syncope and collapse: Secondary | ICD-10-CM

## 2013-12-12 DIAGNOSIS — F411 Generalized anxiety disorder: Secondary | ICD-10-CM

## 2013-12-24 DIAGNOSIS — S0510XA Contusion of eyeball and orbital tissues, unspecified eye, initial encounter: Secondary | ICD-10-CM

## 2014-02-18 DIAGNOSIS — IMO0002 Reserved for concepts with insufficient information to code with codable children: Secondary | ICD-10-CM

## 2014-02-18 DIAGNOSIS — F411 Generalized anxiety disorder: Secondary | ICD-10-CM

## 2014-02-18 DIAGNOSIS — M1710 Unilateral primary osteoarthritis, unspecified knee: Secondary | ICD-10-CM

## 2014-02-18 DIAGNOSIS — E785 Hyperlipidemia, unspecified: Secondary | ICD-10-CM

## 2014-02-18 DIAGNOSIS — F0390 Unspecified dementia without behavioral disturbance: Secondary | ICD-10-CM

## 2014-02-18 DIAGNOSIS — M810 Age-related osteoporosis without current pathological fracture: Secondary | ICD-10-CM

## 2014-02-18 DIAGNOSIS — I10 Essential (primary) hypertension: Secondary | ICD-10-CM

## 2014-02-18 DIAGNOSIS — F430 Acute stress reaction: Secondary | ICD-10-CM

## 2014-02-19 DIAGNOSIS — L03119 Cellulitis of unspecified part of limb: Secondary | ICD-10-CM

## 2014-02-19 DIAGNOSIS — L02419 Cutaneous abscess of limb, unspecified: Secondary | ICD-10-CM

## 2014-04-14 DIAGNOSIS — I6990 Unspecified sequelae of unspecified cerebrovascular disease: Secondary | ICD-10-CM

## 2014-04-14 DIAGNOSIS — F329 Major depressive disorder, single episode, unspecified: Secondary | ICD-10-CM

## 2014-04-14 DIAGNOSIS — F3289 Other specified depressive episodes: Secondary | ICD-10-CM

## 2014-04-14 DIAGNOSIS — I10 Essential (primary) hypertension: Secondary | ICD-10-CM

## 2014-04-14 DIAGNOSIS — F0150 Vascular dementia without behavioral disturbance: Secondary | ICD-10-CM

## 2014-04-14 DIAGNOSIS — M810 Age-related osteoporosis without current pathological fracture: Secondary | ICD-10-CM

## 2014-04-14 DIAGNOSIS — IMO0002 Reserved for concepts with insufficient information to code with codable children: Secondary | ICD-10-CM

## 2014-08-04 DIAGNOSIS — I69398 Other sequelae of cerebral infarction: Secondary | ICD-10-CM

## 2014-08-04 DIAGNOSIS — R451 Restlessness and agitation: Secondary | ICD-10-CM

## 2014-08-04 DIAGNOSIS — I10 Essential (primary) hypertension: Secondary | ICD-10-CM

## 2014-08-04 DIAGNOSIS — F0151 Vascular dementia with behavioral disturbance: Secondary | ICD-10-CM

## 2014-08-04 DIAGNOSIS — M1990 Unspecified osteoarthritis, unspecified site: Secondary | ICD-10-CM

## 2014-08-04 DIAGNOSIS — F323 Major depressive disorder, single episode, severe with psychotic features: Secondary | ICD-10-CM

## 2014-08-04 DIAGNOSIS — M810 Age-related osteoporosis without current pathological fracture: Secondary | ICD-10-CM

## 2014-08-04 DIAGNOSIS — E785 Hyperlipidemia, unspecified: Secondary | ICD-10-CM

## 2014-09-08 DIAGNOSIS — B351 Tinea unguium: Secondary | ICD-10-CM

## 2014-10-08 DIAGNOSIS — I69398 Other sequelae of cerebral infarction: Secondary | ICD-10-CM

## 2014-10-08 DIAGNOSIS — M150 Primary generalized (osteo)arthritis: Secondary | ICD-10-CM

## 2014-10-08 DIAGNOSIS — R451 Restlessness and agitation: Secondary | ICD-10-CM

## 2014-10-08 DIAGNOSIS — F0151 Vascular dementia with behavioral disturbance: Secondary | ICD-10-CM

## 2014-10-08 DIAGNOSIS — I10 Essential (primary) hypertension: Secondary | ICD-10-CM

## 2014-10-08 DIAGNOSIS — F39 Unspecified mood [affective] disorder: Secondary | ICD-10-CM

## 2014-10-23 DEATH — deceased

## 2015-02-14 NOTE — Consult Note (Signed)
PATIENT NAME:  Nathan Nathan [name], Nathan Nathan [name]#:  [id number] DATE OF BIRTH:  [birth year]  DATE OF CONSULTATION:  02/19/2012  REFERRING PHYSICIAN:  Dr. Dolores FrameSung  CONSULTING PHYSICIAN:  Mykeria Garman, MD  PRIMARY CARE PHYSICIAN: Dr. Gavin Nathan Nathan [name]    REASON FOR CONSULTATION: Preop evaluation and medical management.   HISTORY OF PRESENT ILLNESS: Nathan [name] Nathan Nathan [name] is an 81 year old gentleman with history of hypertension, hyperlipidemia, coronary artery disease, TIA, history of gait disturbance, aortic stenosis, and obstructive sleep apnea on CPAP who presents from [facility] status post fall. He reports that he was attempting to get up to go to the bathroom and his foot slipped under him and he fell on his left side. X-ray was revealing for a left-sided femoral neck fracture. He denies any chest pain, shortness of breath. No presyncope or syncope. Reports that currently his pain is under control.   PAST MEDICAL HISTORY:  1. Admitted June 2012 for evaluation of syncope thought to be in the setting of vasovagal versus neurogenic syncope.  2. Hypertension.  3. Colonic polyps.  4. History of prostate and testicular cancer status post orchiectomy and prostatectomy in 1992.  5. History of TIA with presentation of syncope.  6. Basal cell carcinoma status post excision.  7. Degenerative joint disease and degenerative disk disease of the L-spine with chronic back pain.  8. Coronary artery disease.  9. Osteoporosis.  10. Hyperlipidemia.  11. Subdural hematomas.  12. Mild cognitive impairment.  13. Gait disturbance.  14. REM behavior disorder.  15. Carotid artery stenosis.  16. Macular degeneration.  17. Paroxysmal supraventricular tachycardia.  18. Obstructive sleep apnea, on CPAP.  19. Aortic stenosis.  20. History of left proximal humerus fracture.  21. Left clavicular fracture after fall.   PAST SURGICAL HISTORY:  1. Prostatectomy as above and bilateral orchiectomy.  2. Multiple basal cell carcinomas status  post resection from nose and upper lip.  3. Inguinal hernia repair in 1946.   ALLERGIES: Lyrica.   MEDICATIONS:  1. Aspirin 81 mg daily.  2. Vitamin D2 1.25 mg 1 capsule every two weeks.  3. B12 500 mcg 2 tablets in the morning.  4. Tylenol 650 mg b.i.d.  5. Zoloft 100 mg daily.  6. Klonopin 1 mg at bedtime.  7. Trazodone 50 mg at bedtime.  8. Percocet 5/325 one tablet every four hours as needed.  9. Lorazepam 0.5 mg 4 times a day as needed.  10. Risperdal 0.5 mg b.i.d.   FAMILY HISTORY: Stroke in his father, brother, and sister.   SOCIAL HISTORY: He quit tobacco greater than 30 years ago. No alcohol or drug use. He was previously an Art gallery managerengineer. [place] [facility] [place] [place], however, his wife does not live with him. She lives separately in their house.  REVIEW OF SYSTEMS: CONSTITUTIONAL: No fevers, chills, nausea. EYES: History of macular degeneration. ENT: No epistaxis, discharge. RESPIRATORY: Reports he has intermittent cough with is unchanged for a long time. No wheezing or hemoptysis. CARDIOVASCULAR: No chest pain, orthopnea. Reports chronic lower extremity edema that is unchanged. No syncope. GI: No nausea, vomiting, diarrhea, abdominal pain, hematemesis. GU: No dysuria or hematuria. ENDOCRINE: No polyuria or polydipsia. HEME: No bleeding. SKIN: No ulcers. MUSCULOSKELETAL: Status post right hip fracture, pain under control. NEUROLOGIC: History of TIA. PSYCH: Denies any suicidal ideation.   PHYSICAL EXAMINATION:   VITAL SIGNS: Temperature 98.2, pulse 61, respiratory rate 16, blood pressure 213/92, repeat blood pressure 168/89, sating at 92% on room air.   GENERAL: Lying in bed in no  apparent distress.   HEENT: Normocephalic, atraumatic. Pupils are equal and symmetric, nonicteric. Nares without discharge. Moist mucous membranes.  NECK: Soft and supple. No adenopathy or JVP.   CARDIOVASCULAR: Non-tachy with 3+ systolic murmur. No rub or gallops.   LUNGS: Clear to auscultation  bilaterally. No use of accessory muscles or increased respiratory effort.   ABDOMEN: Soft. Positive bowel sounds. No mass appreciated.   EXTREMITIES: 2+ pitting edema bilaterally. Dorsal pedis pulses intact.   MUSCULOSKELETAL: Pain on minimal manipulation of the left lower extremity.   NEUROLOGIC: No dysarthria or aphasia. He has symmetrical strength of his upper extremities. No focal deficits.   PSYCH: He is alert and he is actually oriented to person, place, and situation.   PERTINENT LABS AND STUDIES: Glucose 80, BUN 23, creatinine 0.8, sodium 139, potassium 4.4, chloride 108, carbon dioxide 23, calcium 9.4, total bilirubin 0.4, alkaline phosphatase 115, ALT 25, AST 29, total protein 6.5, albumin 3.3, WBC 9.8, hemoglobin 12.5, hematocrit 37.4, platelets 189, MCV 91. INR 0.9.   EKG with normal sinus rate of 60. No ST elevation or depression.  Pelvis film with left femoral neck fracture.   ASSESSMENT AND PLAN: Nathan [name] Nathan Nathan [name] is a pleasant 81 year old gentleman with history of TIA, hypertension, coronary artery disease, hyperlipidemia, obstructive sleep apnea on CPAP, carotid artery stenosis, aortic stenosis presenting status post mechanical fall with resultant left femoral neck fracture.  1. Preop evaluation. The patient falls into moderate risk category for orthopedic procedure. At baseline he has minimal exertion. Discussed with the patient and wants to proceed with surgery. Resume aspirin postsurgery and EKG has been reviewed.  2. Hypertension, accelerated, likely in the setting of pain, now improved and stable. He is no longer on blood pressure medications.  3. Transient ischemic attack. As above, resume aspirin postsurgery.  4. Obstructive sleep apnea. Continue with CPAP during hospitalization.  5. History of mild cognitive impairment and REM behavior disorder. Restart his Zoloft, trazodone, and Risperdal.  6. Prophylaxis. Per orthopedics postsurgery.   TIME SPENT: Approximately 45  minutes spent on patient care.   ____________________________ Nathan Derby, MD ap:drc D: 02/19/2012 07:27:10 ET T: 02/19/2012 10:47:06 ET JOB#: [id number]  cc: Pearlean Brownie Phylliss Strege, MD, <Dictator>  Nathan Derby MD ELECTRONICALLY SIGNED 02/21/2012 22:58

## 2015-02-14 NOTE — Op Note (Signed)
PATIENT NAME:  [name], Nathan [name] DATE OF BIRTH:  Apr 05, 1923  DATE OF PROCEDURE:  02/19/2012  PREOPERATIVE DIAGNOSIS: Bladder neck contracture.   POSTOPERATIVE DIAGNOSIS: Bladder neck contracture.   PROCEDURE: Cystoscopy with bladder neck dilation/catheter placement.   SURGEON: Jushua Waltman C. Sashia Campas, MD   INDICATION: This is an 81 year old male admitted to Orthopedics after falling sustaining a left subcapital hip fracture. He is scheduled for pinning of left hip fracture. A Foley catheter was ordered on the floor preop; however, nursing staff x3 was unable to place. There is a prior history of a radical prostate[facility] for [facility]. Attempted catheter placement in the staging area was performed and the catheter would not advance beyond the bladder neck.   DESCRIPTION OF PROCEDURE: After spinal anesthesia was obtained, he was placed in supine position. Penis was prepped and draped sterilely. A 14 French cystoscope was lubricated and passed under direct vision. Prostate was absent. There was approximately a 10 French bladder neck contracture noted. A 0.035 guidewire was placed through the cystoscope and easily passed through the strictured area into the bladder. The cystoscope was removed. Over-the-wire dilators from 8 [place] to 2218 [place] were performed without difficulty. A 16 French Councill catheter would not advance into the bladder neck. A 20 French dilator was then placed and the 16 French Councill catheter was placed without difficulty. Balloon was instilled with 10 mL of sterile water and placed to gravity drainage. The patient's hip procedure will be dictated separately by Dr. Hyacinth MeekerMiller.   IMPRESSION: Bladder neck contracture.       RECOMMENDATIONS: I would leave the Foley catheter indwelling x5 days. If he is ready for discharge prior to that date, he can follow-up in our office for catheter removal or can give an order to the skilled nursing  facility.    ____________________________ Verna CzechScott C. Lonna CobbStoioff, MD scs:drc D: 02/19/2012 18:10:42 ET T: 02/20/2012 07:44:21 ET JOB#: [id number]  cc: Lorin PicketScott C. Lonna CobbStoioff, MD, <Dictator> Riki AltesSCOTT C Blanton Kardell MD ELECTRONICALLY SIGNED 03/06/2012 11:03

## 2015-02-14 NOTE — Consult Note (Signed)
General Aspect 81 year old male with history of hypertension, mild aortic valve stenosis, hyperlipidemia, and carotid stenosis (>75%), recent admission last month for chest pain, syncope x 4 per the wife over the past year, Presenting after syncope epsiode.  Wife reports epsiodes of syncope dating back to last June, increasing in frequency recent (past few days). He becomes unresponsiveness, lasting about 45 to 60 seconds, during which it was noted that his blood pressure may drop to 88/60. Notes indicate he loses his strength and does not have much hand grip when he is transferred from the bed to the wheelchair. Today he had another episode. His eyes were open, but he became unresponsive for a short period of time. He was also noted to be pale-looking.    he denies  pain or discomfort, no chest pain.  CAT scan of the head, chest x-ray, was unremarkable.   His troponin was elevated at 1.1, now trending downward.   CODE STATUS is DO NOT RESUSCITATE.     PAST MEDICAL HISTORY:  1. History of syncope x 4 episodes over the past year 2. Hypotension 3. Colon polyps.  4. Prostate cancer status post prostatectomy and orchiectomy.  5. History of transient ischemic attacks.  6. Basal cell cancer. 7. Degenerative joint disease. 8. Osteoporosis. 9. Mild Aortic valve stenosis.  10. Hyperlipidemia. 11. Subdural hematoma. 12. Mild cognitive impairment. 13. Gait disturbance.  14. Carotid stenosis, at least 75 % on left 15. Macular degeneration. 16. Paroxysmal supraventricular tachycardia (details unavailable) 17. Sleep apnea.    Present Illness . FAMILY HISTORY: The patient does not provide much history, but according to the medical records his mother died at the age of 70 from heart attack. His father died of unknown cause.   SOCIAL HABITS: The patient does not provide me much history about his social habits.   SOCIAL HISTORY: The patient lives in a nursing home at [facility]. According to the  records, he is a nonsmoker and no history of alcohol or drug abuse.   Physical Exam:   GEN well developed, well nourished, no acute distress    HEENT red conjunctivae    NECK supple  No masses    RESP normal resp effort  clear BS    CARD Regular rate and rhythm  Murmur    Murmur Systolic    Systolic Murmur Out flow    ABD denies tenderness  soft    LYMPH negative neck    SKIN normal to palpation    NEURO motor/sensory function intact    PSYCH alert, poor insight   Review of Systems:   Subjective/Chief Complaint No complaints, dementia, poor historian    General: No Complaints    Skin: No Complaints    ENT: No Complaints    Eyes: No Complaints    Neck: No Complaints    Respiratory: No Complaints    Cardiovascular: No Complaints    Gastrointestinal: No Complaints    Genitourinary: No Complaints    Vascular: No Complaints    Musculoskeletal: No Complaints    Neurologic: No Complaints    Hematologic: No Complaints    Endocrine: No Complaints    Psychiatric: No Complaints    Review of Systems: All other systems were reviewed and found to be negative    ROS Pt not able to provide ROS    Medications/Allergies Reviewed Medications/Allergies reviewed        Admit Reason:   Syncope: (780.2) 25-Apr-2012, Active, ICD9, Syncope and collapse  Home Medications:  Medication Instructions Status  Risperdal 0.25 mg oral tablet 1 tab(s) orally once a day (in the morning) for psychotic disorders (8 am) Active  Vitamin B-12 500 mcg oral tablet 1 tab(s) orally once a day for supplement (8 am) Active  ferrous sulfate 325 mg (65 mg elemental iron) oral tablet 1 tab(s) orally once a day for iron/hematinic (8 am) Active  aspirin 81 mg oral tablet, chewable 1 tab(s) orally once a day for antiplatelet aggregation (8 am) Active  metoprolol succinate 25 mg oral tablet, extended release 1 tab(s) orally once a day for heart/htn (8 am) Active  Tylenol 325 mg oral tablet 2  tab(s) orally 2 times a day for pain/fever (8 am, 5 pm) Active  calcium-vitamin D 500 mg-200 intl units oral tablet 1 tab(s) orally 2 times a day for calcium supplement (8 am, 8 pm) Active  Vitamin D2 50,000 intl units (1.25 mg) oral capsule 1 cap(s) orally every 2 weeks for vitamin d supplement (8 am) Active  Fosamax 70 mg oral tablet 1 tab(s) orally once a week in early am on Monday before food/meds with water for osteoporosis (6 am) **don't lie down for 30 minutes or crush** Active  Risperdal 0.5 mg oral tablet 1 tab(s) orally once a day (at bedtime) for psychotic disorders (8 pm) Active  trazodone 50 mg oral tablet 1 tab(s) orally once a day (at bedtime) for depression/sleep (8 pm) Active  Klonopin 1 mg oral tablet 1 tab(s) orally once a day (at bedtime) for anxiety (8 pm) Active  Norco 5 mg-325 mg oral tablet 1 tab(s) orally every 6 hours, As Needed- for Pain  Active  Senna S 50 mg-8.6 mg oral tablet 1 tab(s) orally once a day for constipation (8 am) Active  Ativan 0.5 mg oral tablet 1 tab(s) orally every 6 hours, As Needed- for Anxiety, Nervousness (maximum of 4 tablets per day) Active  Norvasc 5 mg oral tablet 1 tab(s) orally once a day (8 am) Active  Zoloft 100 mg oral tablet 1 tab(s) orally once a day (in the morning) (8 am) Active  Debrox 6.5% otic solution 5 drop(s) to each ear once a day (at bedtime) Active  lisinopril 10 mg oral tablet 1 tab(s) orally once a day (8 am) Active   Lab Results:  Hepatic:  03-Jul-13 21:27    Bilirubin, Total 0.3   Alkaline Phosphatase  144   SGPT (ALT) 17 (12-78 NOTE: NEW REFERENCE RANGE 09/15/2011)   SGOT (AST) 24   Total Protein, Serum 7.3   Albumin, Serum  3.3  Routine Chem:  03-Jul-13 21:27    Result Comment troponin - RESULTS VERIFIED BY REPEAT TESTING.  - READ-BACK PROCESS PERFORMED.  - c/o hana Meyer _0 .30 04/24/12 aam  Result(s) reported on 24 Apr 2012 at 10:31PM.   Glucose, Serum  101   BUN  25   Creatinine (comp) 1.12   Sodium,  Serum  135   Potassium, Serum 4.0   Chloride, Serum 101   CO2, Serum 26   Calcium (Total), Serum 9.8   Osmolality (calc) 275   eGFR (African American) >60   eGFR (Non-African American)  59 (eGFR values <57m/min/1.73 m2 may be an indication of chronic kidney disease (CKD). Calculated eGFR is useful in patients with stable renal function. The eGFR calculation will not be reliable in acutely ill patients when serum creatinine is changing rapidly. It is not useful in  patients on dialysis. The eGFR calculation may not be applicable to patients at the  low and high extremes of body sizes, pregnant women, and vegetarians.)   Anion Gap 8  Cardiac:  03-Jul-13 21:27    Troponin I  1.10 (0.00-0.05 0.05 ng/mL or less: NEGATIVE  Repeat testing in 3-6 hrs  if clinically indicated. >0.05 ng/mL: POTENTIAL  MYOCARDIAL INJURY. Repeat  testing in 3-6 hrs if  clinically indicated. NOTE: An increase or decrease  of 30% or more on serial  testing suggests a  clinically important change)  Routine Hem:  03-Jul-13 21:27    WBC (CBC)  14.7   RBC (CBC)  3.89   Hemoglobin (CBC)  11.4   Hematocrit (CBC)  34.5   Platelet Count (CBC) 189 (Result(s) reported on 24 Apr 2012 at 09:52PM.)   MCV 89   MCH 29.3   MCHC 33.1   RDW 14.1   EKG:   Interpretation NSR with T wave ABN V1 to V3, rate 66 bpm    Lyrica: Alt Ment Status  Vital Signs/Nurse's Notes: **Vital Signs.:   04-Jul-13 04:00   Vital Signs Type Admission   Celsius 37.1   Temperature Source Oral   Pulse Pulse 71   Respirations Respirations 18   Systolic BP Systolic BP 381   Diastolic BP (mmHg) Diastolic BP (mmHg) 73   Mean BP 86   Pulse Ox % Pulse Ox % 94   Pulse Ox Activity Level  At rest   Oxygen Delivery Room Air/ 21 %     Impression 81 year old male with history of hypertension, mild aortic valve stenosis, hyperlipidemia, and carotid stenosis (>75%), recent admission last month for chest pain, syncope x 4 per the wife over the  past year, Presenting after syncope epsiode. Low blood pressure noted.  1) Syncope: Worse since last discharge last month numerous factors that could have caused syncope: --Underlying carotid disease --Possible CAD (elevated TNT and EKG changes) --Hypotension (started on three new BP meds on last admission and found to have low BP after syncope)  --I have discussed various treatment options with his wife (20 minutes). She talked with other children. She called back and reported that she did not want any significant testing done (cardiac cath or stenting of carotid).  --Will closely monitor BP. Hold amlodipine given recent hypotension. Will cut metoprolol in 1/2 given heart rate in the 60s. Hold parameters on the lisinopril  2) Dementia: Moderate,  Family does not want aggressive treatments  3)Carotid stenosis Would continue aspirin (could discuss with family if they would like a statin)  4) Mild aortic valve stenosis Should not be contributing to sx  5) Edema: Mild in LE b/l Could be secondary to dependant edema Unable to exclude mild fluid overload. Would proceed cautiously with IVFs   Electronic Signatures: Ida Rogue (MD)  (Signed 04-Jul-13 13:56)  Authored: General Aspect/Present Illness, History and Physical Exam, Review of System, Health Issues, Home Medications, Labs, EKG , Allergies, Vital Signs/Nurse's Notes, Impression/Plan   Last Updated: 04-Jul-13 13:56 by Ida Rogue (MD)

## 2015-02-14 NOTE — H&P (Signed)
PATIENT NAME:  [name], Kyros J MR#:  [id number] DATE OF BIRTH:  1922-10-24  DATE OF ADMISSION:  03/26/2012  PRIMARY CARE PHYSICIAN: Tillman Abideichard Letvak, MD   CHIEF COMPLAINT: Chest pain.   HISTORY OF PRESENT ILLNESS: This is an 81 year old male with history of hypertension, heart disease, aortic stenosis, hyperlipidemia, and carotid stenosis. He presents to the ER after he was awoken from sleep with chest pain more in the right lower sternal border described as a dull knifelike ache, not too severe, lasting a couple of hours. Now it is gone. No shortness of breath. No nausea. No sweating. Nothing made it better or worse but he thought he was having a heart attack. Since he has not been walking since his hip fracture, he did have a CT scan of the chest in the Emergency Room which showed no pulmonary embolism nor acute thoracic pathology. Hospitalist services were contacted for further evaluation.   PAST MEDICAL HISTORY:  1. History of syncope. 2. Hypertension.  3. Colon polyps.  4. Prostate cancer status post prostatectomy and orchiectomy.  5. History of transient ischemic attacks.  6. Basal cell cancer. 7. Degenerative joint disease. 8. Osteoporosis. 9. Coronary artery disease  10. Hyperlipidemia. 11. Subdural hematoma. 12. Mild cognitive impairment. 13. Gait disturbance.  14. Carotid stenosis.  15. Macular degeneration. 16. Paroxysmal supraventricular tachycardia. 17. Sleep apnea. 18. Aortic stenosis.   PAST SURGICAL HISTORY:  1. Prostatectomy. 2. Bilateral orchiectomy. 3. Numerous basal cell carcinomas. 4. Inguinal hernia repair bilaterally. 5. Hip fracture.   ALLERGIES: Lyrica.   MEDICATIONS:  1. Aspirin 81 mg daily.  2. Calcium and Vitamin D twice a day.  3. Ferrous sulfate 325 mg daily.  4. Fosamax 70 mg weekly. 5. Klonopin 1 mg at bedtime.  6. Lorazepam 0.5 mg 4 times a day.  7. Norco 5/325 1 to 2 tablets every six hours as needed.  8. Risperdal 0.5 mg twice a day.   9. Senna S 1 tablet daily.  10. Trazodone 50 mg at bedtime.  11. Tylenol p.r.n.  12. Vitamin B12 500 mcg daily.  13. Vitamin D2 50,000 units every two weeks.  14. Zoloft 100 mg daily.   FAMILY HISTORY: Mother died at 3176 of an MI. Father died of unknown cause.   SOCIAL HISTORY: Currently at [facility]. No smoking. No alcohol or drug use.   REVIEW OF SYSTEMS: CONSTITUTIONAL: The patient complains of no fever, chills, or sweats. No weight loss. No weight gain. EYES: He does wear glasses. EARS, NOSE, MOUTH, AND THROAT: No hearing loss. No sore throat. No difficulty swallowing. CARDIOVASCULAR: Positive for chest pain. No palpitations. RESPIRATORY: No shortness of breath. Rare cough. No sputum. No hemoptysis. GASTROINTESTINAL: No nausea. No vomiting. No abdominal pain. No diarrhea. No bright red blood per rectum. No melena. GENITOURINARY: No burning on urination. No hematuria. MUSCULOSKELETAL: No joint pain. INTEGUMENTARY: No rashes or eruptions. NEUROLOGIC: No fainting or blackouts. PSYCHIATRIC: On medication for anxiety. ENDOCRINE: No thyroid problems. HEMATOLOGIC/LYMPHATIC: History of anemia.   PHYSICAL EXAMINATION:   VITAL SIGNS: Temperature 98, pulse 68, respirations 18, blood pressure 176/92, pulse oximetry 95% on room air.   GENERAL: No respiratory distress.   EYES: Conjunctivae and lids normal. Pupils equal, round, and reactive to light. Extraocular muscles intact. No nystagmus.   EARS, NOSE, MOUTH, AND THROAT: Tympanic membranes no erythema. Nasal mucosa no erythema. Throat no erythema. No exudate seen. Lips and gums no lesions.   NECK: No JVD. No bruits. No lymphadenopathy. No thyromegaly. No thyroid nodules  palpated.   RESPIRATORY: Lungs clear to auscultation. No use of accessory muscles to breathe. No rhonchi, rales, or wheeze heard.   CARDIOVASCULAR: S1, S2 normal. No gallops or rubs heard. +4 out of 6 systolic ejection murmur. Carotid upstroke 2+ bilaterally. No bruits. Dorsalis  pedis pulses 1+ bilaterally. Trace edema of the lower extremity.   ABDOMEN: Soft, nontender. No organomegaly/splenomegaly. Normoactive bowel sounds. No masses felt.   LYMPHATIC: No lymph nodes in the neck.   MUSCULOSKELETAL: No clubbing, edema, or cyanosis.   SKIN: No ulcers or lesions. Dry skin on the feet.   NEUROLOGIC: Cranial nerves II through XII grossly intact. Deep tendon reflexes half plus bilateral lower extremities.   PSYCHIATRIC: The patient is oriented to person and place. Does answer some yes and no questions appropriately. Does get confused on some things that he is trying to remember.   LABORATORY, DIAGNOSTIC, AND RADIOLOGICAL DATA: EKG sinus rhythm, premature atrial complexes, 68 beats per minute, no acute ST-T wave changes.   ASSESSMENT AND PLAN:  1. Chest pain with history of aortic stenosis. The patient has been nonambulatory since hip surgery. A CT scan of the chest was negative for pulmonary embolism or aortic dissection. ER physician is still concerned about patient cardiac-wise. Will admit as observation. EKG unremarkable and first troponin negative. I will get serial cardiac enzymes to rule out myocardial infarction. Put on telemetry monitoring. Obtain an echocardiogram to determine the degree of aortic stenosis. I am hesitant on stress test in an elderly patient with aortic stenosis. Will continue aspirin and will add low dose Toprol.  2. Hypertension. Will add low dose Toprol.  3. Carotid stenosis. Wife asked me to get a carotid ultrasound here in the hospital. Since he already had a CT scan of the chest, I will be unable to get an angiogram while he is here but I can refer to Vascular Surgery upon discharge.  4. History of SVT.  5. Mild cognitive impairment and anxiety. Continue psychiatric medications.  6. Osteoporosis. Continue Fosamax and Calcium and Vitamin D.  TIME SPENT ON ADMISSION: 55 minutes.   CODE STATUS: The patient is a DO NOT RESUSCITATE.    ____________________________ Herschell Dimes. Renae Gloss, MD [id number] D: 03/26/2012 10:49:55 ET T: 03/26/2012 11:52:45 ET JOB#: [id number]  cc: Herschell Dimes. Renae Gloss, MD, <Dictator> Karie Schwalbe, MD Salley Scarlet MD ELECTRONICALLY SIGNED 03/28/2012 17:26

## 2015-02-14 NOTE — Consult Note (Signed)
No complaints of catheter irritationFoley draining clear urine Bladder neck contracture status post dilationDiscontinue Foley 5/3.  If going to rehabilitation an order can be written for catheter to be removed at SNF.  Electronic Signatures: Riki AltesStoioff, Teddrick Mallari C (MD)  (Signed on 30-Apr-13 18:05)  Authored  Last Updated: 30-Apr-13 18:05 by Riki AltesStoioff, Craigory Toste C (MD)

## 2015-02-14 NOTE — H&P (Signed)
Subjective/Chief Complaint Pain left hip    History of Present Illness 81 year old male with dementia fell in the night going to the bathroom at assitsted living at [facility].  Brought to Emergency Room where exam and X-rays show a left subcapital hip fracture without significant displacement. Admitted for medical evaluationa and surgery.   Have discussed treatment options with wife and daughter.  I believe that pinning of the fracture is reasonable for this fracture and they agree to this.  Risks and benefits of surgery were discussed at length including but not limited to infection, non union, nerve or blood vessed damage, non union, need for repeat surgery, blood clots and lung emboli, and death.  The possibility of fracture failure and avascular necrosis were discussed along with the possibility of further surgery if needed.   Past Med/Surgical Hx:  TIA - Transient Ischemic Attack:   Hyperlipidemia:   Orchiectomy; Bilaterally:   Hx of Multiple Skin Carcinoma's:   Hx of Prostate Carcinoma:   Hx of DJD/DDD LS Spine:   Hypertension:   Chronic Myofascial Back Pain:   eye problem:   Inguinal Herniorraphy; Bilaterally:   Total Prostatectomy:   ALLERGIES:  Lyrica: Alt Ment Status  HOME MEDICATIONS: Medication Instructions Status  aspirin 81 mg oral tablet, chewable 1 tab(s) orally once a day at 8am Active  Risperdal 0.5 mg oral tablet 1 tab(s) orally 2 times a day at 8am and 8pm Active  Zoloft 100 mg oral tablet 1 tab(s) orally once a day at 8pm for depression Active  Klonopin 1 mg oral tablet 1 tab(s) orally once a day at bedtime at 8pm for anxiety Active  trazodone 50 mg oral tablet 1 tab(s) orally once a day at bedtime at 8pm for sleeping Active  Tylenol 325 mg oral tablet 2 tab(s) orally 2 times a day at 8am and 5pm for pain/fever Active  acetaminophen-oxycodone 325 mg-5 mg oral tablet 1 tab(s) orally every 4 hours, As Needed- for Pain. No PRN tylenol when getting percocet Active   lorazepam 0.5 mg oral tablet 1 tab(s) orally 4 times a day, As Needed- for Anxiety (limit 4 doses per 24hrs) Active  Vitamin B-12 500 mcg oral tablet 2 tab(s) orally once a day at 8am for a supplement Active  Vitamin D2 50,000 intl units (1.25 mg) oral capsule 1 cap(s) orally every 2wks. Last dose on 02/14/2012 Active   Family and Social History:   Family History Non-Contributory    Social History negative tobacco, negative ETOH    Place of Living Assisted living   Review of Systems:   Fever/Chills No    Cough No    Sputum No    Abdominal Pain No   Physical Exam:   GEN well developed, well nourished, no acute distress    HEENT pink conjunctivae    NECK supple  No masses    RESP normal resp effort    CARD regular rate    ABD denies tenderness    GU foley catheter in place    LYMPH negative neck    EXTR negative edema, Left leg with pain on range of motion.  No shortening.  circulation/sensation/motor function good.  skin intact.    SKIN normal to palpation    NEURO motor/sensory function intact    PSYCH alert, poor insight   Routine Chem:  29-Apr-13 05:37    Glucose, Serum 80   BUN 23   Creatinine (comp) 0.80   Sodium, Serum 139  Potassium, Serum 4.4   Chloride, Serum 108   CO2, Serum 23   Calcium (Total), Serum 9.4  Hepatic:  29-Apr-13 05:37    Bilirubin, Total 0.4   Alkaline Phosphatase 115   SGPT (ALT) 25   SGOT (AST) 29   Total Protein, Serum 6.5   Albumin, Serum 3.3  Routine Chem:  29-Apr-13 05:37    Osmolality (calc) 280   eGFR (African American) >60   eGFR (Non-African American) >60   Anion Gap 8  Routine Hem:  29-Apr-13 05:37    WBC (CBC) 9.8   RBC (CBC) 4.10   Hemoglobin (CBC) 12.5   Hematocrit (CBC) 37.4   Platelet Count (CBC) 189   MCV 91   MCH 30.5   MCHC 33.3   RDW 14.4  Routine Coag:  29-Apr-13 05:37    Prothrombin 12.8   INR 0.9  Routine BB:  29-Apr-13 07:07    Antibody Screen NEGATIVE  Rhogam:  29-Apr-13 07:07     Notification Date 02-19-12   Notification Time 0950   Radiology Results: XRay:    29-Apr-13 05:11, Hip Left Complete   Hip Left Complete   REASON FOR EXAM:    FALL, L HIP PAIN  COMMENTS:   May transport without cardiac monitor    PROCEDURE: DXR - DXR HIP LEFT COMPLETE  - Feb 19 2012  5:11AM     RESULT: There is a minimally displaced impaction-type fracture of the   subcapital portion ofthe left femoral neck. No other fractures are seen.    IMPRESSION:     Fracture of the left femoral neck.    Thank you for the opportunity to contribute to the care of your patient.         Verified By: Dionne Ano WALL, M.D., MD    29-Apr-13 05:11, Pelvis AP Only   Pelvis AP Only   REASON FOR EXAM:    FALL, L HIP PAIN  COMMENTS:   May transport without cardiac monitor    PROCEDURE: DXR - DXR PELVIS AP ONLY  - Feb 19 2012  5:11AM     RESULT: Comparison is made to a prior 10/21/2010. There is noted an   impaction-type fracture ofthe left femoral neck. No additional fractures   of the bony pelvis are seen. Postoperative metallic clips are noted in   the pelvic area. There is calcification in the proximal femoral arteries   bilaterally.    IMPRESSION:     There is deformity ofthe left femoral neck compatible with an   impaction-type fracture.  Thank you for the opportunity to contribute to the care of your patient.           Verified By: Dionne Ano WALL, M.D., MD     Assessment/Admission Diagnosis Left subcapital hip fracture with minimal displacement    Plan Pinning left hip fracture.   Electronic Signatures: Park Breed (MD)  (Signed 29-Apr-13 11:09)  Authored: CHIEF COMPLAINT and HISTORY, PAST MEDICAL/SURGIAL HISTORY, ALLERGIES, HOME MEDICATIONS, FAMILY AND SOCIAL HISTORY, REVIEW OF SYSTEMS, PHYSICAL EXAM, LABS, Radiology, ASSESSMENT AND PLAN   Last Updated: 29-Apr-13 11:09 by Park Breed (MD)

## 2015-02-14 NOTE — Discharge Summary (Signed)
PATIENT NAME:  Nathan [name], Nathan [name] MR#:  [id number] DATE OF BIRTH:  09-27-1923  DATE OF ADMISSION:  02/19/2012 DATE OF DISCHARGE:  02/22/2012  FINAL DIAGNOSES:  1. Minimally displaced subcapital fracture of the left hip.  2. Moderate dementia.  3. Hypertension.  4. Colonic  polyps.  5. History of prostate and testicular cancer 1992. 6. History of transient ischemic attack with syncope.  7. Basal cell carcinoma.  8. Degenerative joint disease.  9. Degenerative disk disease.  10. Coronary artery disease.  11. Osteoporosis.  12. Hyperlipidemia.  13. Subdural hematomas. 14. Gait disturbance.  15. REM behavior disorder.  16. Carotid artery stenosis.  17. Macular degeneration.  18. Paroxysmal supraventricular tachycardia.  19. Sleep apnea.  20. Aortic stenosis.  21. History of left proximal femur fracture. 22. Left clavicle fracture or upper.   OPERATION: 02/19/2012 percutaneous pinning left subcapital hip fracture.   COMPLICATIONS: None.   CONSULTATIONS: [facility].   DISCHARGE MEDICATIONS:  1. Fosamax 70 mg weekly.  2. Calcium with vitamin D b.i.d.  3. Klonopin 1 mg at bedtime.  4. Ativan 0.5 mg q. 6 h. p.r.n. anxiety. 5. Risperdal 0.5 mg b.i.d.  6. Zoloft 100 mg daily.  7. Desyrel 50 mg at bedtime.  8. Norco 5/325, 1 to 2 q. 6 hours p.r.n. pain.  9. Enteric-coated aspirin one p.o. b.i.d.  10. Iron 325 mg daily.  11. Stool softeners as needed.  12. Vitamin D 50,000 units p.o. every two weeks. 13. Vitamin B12 500 mcg daily.    HISTORY OF PRESENT ILLNESS: The patient is an 81 year old male who [facility] and fell going to the bathroom early in the morning of the day of admission.  He was brought to the Emergency Room with left hip pain. Exam and x-ray showed a minimally displaced subcapital fracture of the left hip. He was admitted for medical evaluation and surgery. It was my feeling that the fracture was amenable to pinning and did not require a hemiarthroplasty.   I discussed surgical options with the patient and his family. They agreed to proceed with the proposed pinning.   PAST MEDICAL HISTORY: As above.   PAST SURGICAL HISTORY:  1. Prostatectomy. 2. Bilateral orchiectomy. 3. Basal cell carcinoma resections. 4. Inguinal hernia repair 1946.   ALLERGIES: Lyrica.   FAMILY HISTORY: Positive for stroke in his father, brother, and sister.   REVIEW OF SYSTEMS: Unremarkable.   SOCIAL HISTORY:  He smoked until 30 years ago. [facility] lives at [facility].   PHYSICAL EXAMINATION: Vital signs were normal. The patient was alert and cooperative but had confusion. The left leg was rotated but not shortened. He had pain with movement. Neurovascular status was good. No other significant injuries were noted.   LABORATORY DATA: Laboratory data on admission was satisfactory.   HOSPITAL COURSE: On 02/19/2012 the patient underwent percutaneous pinning of the left hip without problems. Postoperatively he did well with no problems. His hemoglobin remained in good control.  Physical therapy started working with him. Because of his confusion, progress was slow. We are trying to keep him toe-touch weightbearing for about six weeks. He is stable and ready for transfer to skilled nursing facility today. Rehabilitation potential is fair. He is to be toe-touching on the left.  He will be seen in my office in two weeks for exam and x-ray.    ____________________________ Valinda HoarHoward E. Verdis Bassette, MD hem:bjt D: 02/22/2012 12:11:48 ET T: 02/22/2012 12:23:52 ET JOB#: [id number]  cc: Valinda HoarHoward E. Shriyans Kuenzi, MD, <  Dictator> Letitia Caul, MD Valinda Hoar MD ELECTRONICALLY SIGNED 02/23/2012 12:54

## 2015-02-14 NOTE — Discharge Summary (Signed)
PATIENT NAME:  Nathan [name], Nathan [name] MR#:  [id number] DATE OF BIRTH:  24-Apr-1923  DATE OF ADMISSION:  03/26/2012 DATE OF DISCHARGE:  03/27/2012  DIAGNOSES:  1. Recurrent syncope, possibly related to hypotension due to multiple antihypertensive medications, underlying coronary artery disease and carotid disease. Patient's family does not want any further aggressive invasive work-up. 2. Possible non-ST elevation myocardial infarction.  3. Leukocytosis.  4. Anemia. 5. Hyperglycemia. 6. History of sleep apnea. Patient is not any continuous positive airway pressure.  7. Left carotid stenosis 75%. 8. History of hypertension. 9. Transient ischemic attack. 10. Coronary artery disease.  11. Hyperlipidemia.  12. History of subdural hematoma. 13. Prostate cancer. 14. Degenerative joint disease. 15. Paroxysmal supraventricular tachycardia.  16. Macular degeneration. 17. Mild aortic stenosis.   DISPOSITION: Patient is being discharged to skilled nursing facility.   DIET: Low sodium.   ACTIVITY: As tolerated.   FOLLOW UP: Follow up with primary care physician, Dr. Rolm Gala, in 1 to 2 weeks after discharge.   DISCHARGE MEDICATIONS:  1. Lisinopril 10 mg daily, hold for systolic blood pressure less than 120. 2. Aspirin 325 mg daily.  3. Zocor 40 mg daily.  4. Toprol-XL 12.5 mg daily, hold for systolic blood pressure less than 120. 5. Debrox 6.5% otic solution 5 drops to each ear once a day at bedtime.  6. Zoloft 100 mg daily.  7. Ativan 0.5 mg every six hours p.r.n.  8. Senna 50/8.6, 1 tablet once a day for constipation. 9. Norco 5/325, 1 tablet q.6 hours p.r.n. for pain. 10. Klonopin 1 mg at bedtime for anxiety.  11. Trazodone 50 mg at bedtime.  12. Risperdal 0.5 mg at bedtime.  13. Fosamax 70 mg once a week on Mondays.  14. Vitamin D2 50,000 international units, 1 capsule every two weeks.  15. Calcium with vitamin D 5/200, 1 tablet b.i.d.  16. Tylenol 325 mg 2 tablets twice a day as  needed for pain or fever. 17. Ferrous sulfate 325 mg once a day.  18. Vitamin B12 500 mcg once a day.  19. Risperdal 0.25 mg once a day in the morning.   CONSULTATION: Cardiology consultation with Dr. Mariah Milling.  LABORATORY, DIAGNOSTIC AND RADIOLOGICAL DATA: CT of the head showed no acute abnormalities. Chest x-ray no acute abnormalities CBC showed white count of 14.7, hemoglobin 11.4. Urinalysis showed no evidence of infection. Glucose 101, BUN 25, creatinine 1.12, sodium 135, potassium 4, chloride 101, CO2 26, VLDL 14, LDL 69, cholesterol 129, triglycerides 71, HDL 46. Cardiac enzymes: Troponin 1.10 to 0.66.    HOSPITAL COURSE: Patient is an 81 year old male with past medical history of dementia with agitation, aggression, recurrent syncope, hypertension, transient ischemic attack, coronary artery disease who presented with recurrent syncopal episodes described as unresponsiveness for 45 to 60 seconds associated with hypotension. Patient was admitted for similar symptoms in 03/2011 and was admitted with syncope. Work-up showed that he had normal EF. MRI of the brain was negative. The only abnormality detected was carotid stenosis on left side for 50% to 75%. He was felt to have vasovagal versus neurogenic syncope and was supposed to have additional work-up at [facility]. As per the patient's wife he had been having recurrent syncope more so recently associated with hypotension. He was also found to have NSTEMI with elevated troponin. Patient himself denied any chest pain. He was admitted 03/2012 for chest pain and he was ruled out with negative serial cardiac enzymes. for acute coronary syndrome. His echo done at that time showed normal  ejection fraction with no wall motion abnormalities and mild aortic stenosis. A cardiology consultation with Dr. Mariah MillingGollan was held and the patient's case was discussed with the patient's wife. He felt that the patient's syncope could be related to patient's carotid disease, possible  underlying coronary artery disease and hypotension induced by multiple antihypertensive medications. He recommended reducing the number of patient's antihypertensive medications and placing holding parameters on all of them. The patient's wife did not want any aggressive invasive workup including catheterization. She wanted her husband to be just comfortable. Patient was evaluated by hospice during the hospitalization but he did not meet hospice criteria. He is, however, a DO NOT RESUSCITATE. His CAT scan of the head was negative. He had mild leukocytosis which was felt to be reactive. There are no signs of any infection. His chest x-ray and urinalysis were negative. Patient has history of sleep apnea as per old records, he is not on CPAP. Given his intermittent agitation and aggression he is not a good candidate. The family does not want any intervention done for his carotid stenosis. Patient is being discharged back to his skilled nursing facility in a stable condition. The dose of his antihypertensive medications has been reduced. He had no further episodes of unresponsiveness during the hospitalization.   TIME SPENT: 45 minutes.   ____________________________ Darrick MeigsSangeeta Adalynd Donahoe, MD sp:cms D: 04/26/2012 12:06:00 ET T: 04/26/2012 12:57:00 ET JOB#: [id number]  cc: Darrick MeigsSangeeta Dave Mannes, MD, <Dictator> Letitia CaulHeidi M. Grandis, MD Darrick MeigsSANGEETA Allix Blomquist MD ELECTRONICALLY SIGNED 04/26/2012 14:17

## 2015-02-14 NOTE — Discharge Summary (Signed)
PATIENT NAME:  Nathan [name], Nathan [name] MR#:  [id number] DATE OF BIRTH:  02/09/1923  DATE OF ADMISSION:  04/25/2012 DATE OF DISCHARGE:  04/26/2012  DIAGNOSES:  1. Recurrent syncope, possibly related to hypotension due to multiple antihypertensive medications, underlying coronary artery disease and carotid disease. Patient's family does not want any further aggressive invasive work-up. 2. Possible non-ST elevation myocardial infarction.  3. Leukocytosis.  4. Anemia. 5. Hyperglycemia. 6. History of sleep apnea. Patient is not any continuous positive airway pressure.  7. Left carotid stenosis 75%. 8. History of hypertension. 9. Transient ischemic attack. 10. Coronary artery disease.  11. Hyperlipidemia.  12. History of subdural hematoma. 13. Prostate cancer. 14. Degenerative joint disease. 15. Paroxysmal supraventricular tachycardia.  16. Macular degeneration. 17. Mild aortic stenosis.   DISPOSITION: Patient is being discharged to skilled nursing facility.   DIET: Low sodium.   ACTIVITY: As tolerated.   FOLLOW UP: Follow up with primary care physician, Dr. Rolm GalaHeidi Grandis, in 1 to 2 weeks after discharge.   DISCHARGE MEDICATIONS:  1. Lisinopril 10 mg daily, hold for systolic blood pressure less than 120. 2. Aspirin 325 mg daily.  3. Zocor 40 mg daily.  4. Toprol-XL 12.5 mg daily, hold for systolic blood pressure less than 120. 5. Debrox 6.5% otic solution 5 drops to each ear once a day at bedtime.  6. Zoloft 100 mg daily.  7. Ativan 0.5 mg every six hours p.r.n.  8. Senna 50/8.6, 1 tablet once a day for constipation. 9. Norco 5/325, 1 tablet q.6 hours p.r.n. for pain. 10. Klonopin 1 mg at bedtime for anxiety.  11. Trazodone 50 mg at bedtime.  12. Risperdal 0.5 mg at bedtime.  13. Fosamax 70 mg once a week on Mondays.  14. Vitamin D2 50,000 international units, 1 capsule every two weeks.  15. Calcium with vitamin D 5/200, 1 tablet b.i.d.  16. Tylenol 325 mg 2 tablets twice a day as  needed for pain or fever. 17. Ferrous sulfate 325 mg once a day.  18. Vitamin B12 500 mcg once a day.  19. Risperdal 0.25 mg once a day in the morning.   CONSULTATION: Cardiology consultation with Dr. Mariah MillingGollan.  LABORATORY, DIAGNOSTIC AND RADIOLOGICAL DATA: CT of the head showed no acute abnormalities. Chest x-ray no acute abnormalities CBC showed white count of 14.7, hemoglobin 11.4. Urinalysis showed no evidence of infection. Glucose 101, BUN 25, creatinine 1.12, sodium 135, potassium 4, chloride 101, CO2 26, VLDL 14, LDL 69, cholesterol 129, triglycerides 71, HDL 46. Cardiac enzymes: Troponin 1.10 to 0.66.    HOSPITAL COURSE: Patient is an 81 year old male with past medical history of dementia with agitation, aggression, recurrent syncope, hypertension, transient ischemic attack, coronary artery disease who presented with recurrent syncopal episodes described as unresponsiveness for 45 to 60 seconds associated with hypotension. Patient was admitted for similar symptoms in 03/2011 and was admitted with syncope. Work-up showed that he had normal EF. MRI of the brain was negative. The only abnormality detected was carotid stenosis on left side for 50% to 75%. He was felt to have vasovagal versus neurogenic syncope and was supposed to [facility]. As per the patient's wife he had been having recurrent syncope more so recently associated with hypotension. He was also found to have NSTEMI with elevated troponin. Patient himself denied any chest pain. He was admitted 03/2012 for chest pain and he was ruled out with negative serial cardiac enzymes. for acute coronary syndrome. His echo done at that time showed normal  ejection fraction with no wall motion abnormalities and mild aortic stenosis. A cardiology consultation with Dr. Mariah MillingGollan was held and the patient's case was discussed with the patient's wife. He felt that the patient's syncope could be related to patient's carotid disease, possible  underlying coronary artery disease and hypotension induced by multiple antihypertensive medications. He recommended reducing the number of patient's antihypertensive medications and placing holding parameters on all of them. The patient's wife did not want any aggressive invasive workup including catheterization. She wanted her husband to be just comfortable. Patient was evaluated by hospice during the hospitalization but he did not meet hospice criteria. He is, however, a DO NOT RESUSCITATE. His CAT scan of the head was negative. He had mild leukocytosis which was felt to be reactive. There are no signs of any infection. His chest x-ray and urinalysis were negative. Patient has history of sleep apnea as per old records, he is not on CPAP. Given his intermittent agitation and aggression he is not a good candidate. The family does not want any intervention done for his carotid stenosis. Patient is being discharged back to his skilled nursing facility in a stable condition. The dose of his antihypertensive medications has been reduced. He had no further episodes of unresponsiveness during the hospitalization.   TIME SPENT: 45 minutes.   ____________________________ Darrick MeigsSangeeta Mkayla Steele, MD sp:cms D: 04/26/2012 12:06:00 ET T: 04/26/2012 12:57:00 ET JOB#: [id number]  cc: Darrick MeigsSangeeta Neveen Daponte, MD, <Dictator> Letitia CaulHeidi M. Grandis, MD Darrick MeigsSANGEETA Vickey Boak MD ELECTRONICALLY SIGNED 04/26/2012 14:17

## 2015-02-14 NOTE — Discharge Summary (Signed)
PATIENT NAME:  Nathan [name], Nathan [name] MR#:  [id number] DATE OF BIRTH:  1923/02/27  DATE OF ADMISSION:  03/26/2012 DATE OF DISCHARGE:  03/27/2012  PRIMARY CARE PHYSICIAN: Dr. Alphonsus Sias   FINAL DIAGNOSES:  1. Chest pain, not cardiac.  2. Hypertension.  3. Carotid stenosis.  4. Anxiety, depression, dementia.  5. Osteoporosis.  6. Poor ambulation with recent hip fracture.   MEDICATIONS ON DISCHARGE:  1. Aspirin 81 mg daily.  2. Risperdal 0.5 mg twice a day. 3. Zoloft 100 mg daily.  4. Klonopin 1 mg once a day. 5. Trazodone 50 mg at bedtime.  6. Tylenol 650 mg twice a day. 7. Lorazepam 0.5 mg q.i.d. as needed for anxiety. 8. Vitamin D2 50,000 units every two weeks.  9. Vitamin B12 500 mcg daily.  10. Ferrous sulfate 325 mg daily.  11. Calcium and vitamin D 1 tablet twice a day.  12. Senna S 58.6, 1 tablet daily.  13. Norco 5/325, 1 to 2 tablets every six hours as needed for pain. 14. Fosamax 70 mg once a week.   ADDITIONAL MEDICATIONS:  1. Metoprolol ER 25 mg daily.  2. Amlodipine 10 mg daily.  3. Lisinopril 10 mg twice a day.   ACTIVITY: As tolerated.   DIET: Low sodium diet.   REFERRAL: Physical therapy 1 to 2 days. Follow up with Dr. Alphonsus Sias 1 to 2 weeks. Follow up with Dr. Gilda Crease of vascular surgery.   LABORATORY, DIAGNOSTIC AND RADIOLOGICAL DATA: PT/ INR, PTT normal range. Troponin negative. White blood cell count 8.8, hemoglobin and hematocrit 13.2 and 39.4, platelet count 202, glucose 98, BUN 15, creatinine 0.98, sodium 142, potassium 3.9, chloride 106, CO2 26, calcium 9.9. Liver function tests normal. Chest x-ray: Minimal basilar atelectasis. Lung fields are clear. CT scan of the chest showed no acute pulmonary embolism or thoracic aortic pathology. Bibasilar atelectasis. No evidence of congestive heart failure. Tiny cyst in the liver, similar findings seen in 2008. Ultrasound of the carotid bilaterally showed significant stenosis in distal common carotid on the left estimated  around 75%. Cardiac enzymes x3 were negative. LDL 116, HDL 45, triglycerides 88. Echocardiogram showed an ejection fraction of greater than 55%, impaired left ventricular relaxation.   HOSPITAL COURSE PER PROBLEM LIST:  1. For the patient's chest pain, this had resolved. I believe this is noncardiac. Patient had a negative CT scan of the chest, ruled out by cardiac enzymes. Because of his age and history of aortic stenosis I was hesitant on ordering a stress test on this patient. Medical management recommended. It was listed as mild valvular aortic stenosis on echocardiogram.  2. For his hypertension, blood pressure elevated during the hospital course. Patient had to be started on Toprol, Norvasc and lisinopril. Nitro patch was also given but that was stopped upon discharge. Blood pressure as high as 210/70 during the hospital course but on discharge 135/68. Close clinical follow up of blood pressure as outpatient. Recommend checking a BMP in one week since starting lisinopril.  3. Anxiety, depression, dementia. Patient is on numerous psychiatric medications which were all continued.  4. Carotid stenosis. Patient has a known history of carotid stenosis. Ultrasound did show 75% blockage. I could not get a CT angiogram of the neck on this hospitalization because the patient did have a CT scan of the chest. Recommended follow up outpatient with vascular surgery. I was hoping that appointment was scheduled prior to discharge but I am not sure if it was. Please schedule as outpatient.  5. For osteoporosis,  on calcium and vitamin D and Fosamax. 6. For poor ambulation with recent hip fracture, patient is in skilled nursing, physical therapy evaluation. Can consider deep vein thrombosis prophylaxis but will leave that up to Dr. Alphonsus SiasLetvak.   TIME SPENT ON DISCHARGE: 35 minutes.   ____________________________ Herschell Dimesichard J. Renae GlossWieting, MD rjw:cms D: 03/27/2012 16:59:44 ET T: 03/28/2012 10:57:59  ET JOB#: [id number]  cc: Herschell Dimesichard J. Renae GlossWieting, MD, <Dictator> Karie Schwalbeichard I. [place], MD Salley ScarletICHARD J Korver Graybeal MD ELECTRONICALLY SIGNED 03/28/2012 17:27

## 2015-02-14 NOTE — H&P (Signed)
PATIENT NAME:  Nathan [name], Nathan J MR#:  [id number] DATE OF BIRTH:  [birth year]  DATE OF ADMISSION:  04/25/2012  PRIMARY CARE PHYSICIAN: Dr. Tillman Abideichard Letvak   CHIEF COMPLAINT: Episodes of unresponsiveness.   HISTORY OF PRESENT ILLNESS: Mr. Nathan [name] is an 81 year old Caucasian male resident of [facility]. The patient was noticed since July 2nd to have episodes of unresponsiveness that lasted about 45 to 60 seconds, during which it was noted that his blood pressure may drop to 88/60. He will lose his strength and does not have much hand grip when he is transferred from the bed to the wheelchair. Today he had another episode. His eyes were open, but he became unresponsive for a short period of time. He was also noted to be pale-looking. The patient does not provide me with any meaningful history. The only thing that I am sure about is that he does not have pain or discomfort. Specifically, he denies having any chest pain. Evaluation here at the Emergency Department was unremarkable including CAT scan of the head, chest x-ray, and EKG although the EKG showed subtle new changes compared to his old EKG that was done a month ago. His troponin is elevated at 1.1. The patient was admitted for further evaluation and treatment. It is worth mentioning that the patient's CODE STATUS is DO NOT RESUSCITATE.   REVIEW OF SYSTEMS: A 10-point system review was unobtainable due to the patient's dementia.   PAST MEDICAL HISTORY: The patient does have history of: 1. Syncope. 2. Hypertension. 3. Transient ischemic attacks. 4. Carotid stenosis.  5. A month ago he was admitted here, and he had carotid ultrasound which showed 75% stenosis. Coronary artery disease. 6. Hyperlipidemia. 7. Osteoporosis.  8. Past history of subdural hematoma and mild cognitive impairment, gait disturbances. 9. Macular degeneration. 10. Paroxysmal supraventricular tachycardia. 11. Obstructive sleep apnea syndrome. 12. Aortic  stenosis.  13. Prostatic cancer, status post prostatectomy and orchiectomy.   PAST SURGICAL HISTORY:  1. Bilateral orchiectomy and prostatectomy. 2. Basal cell carcinoma removal. 3. Inguinal hernia repair bilaterally.  4. Hip fracture.   FAMILY HISTORY: The patient does not provide much history, but according to the medical records his mother died at the age of 79 from heart attack. His father died of unknown cause.   SOCIAL HABITS: The patient does not provide me much history about his social habits.   SOCIAL HISTORY: The patient lives in a nursing home at [facility]. According to the records, he is a nonsmoker and no history of alcohol or drug abuse.   ADMISSION MEDICATIONS:  1. Zoloft 100 mg a day. 2. Trazodone 50 mg at night. 3. Risperdal 0.25 mg once a day and 0.5 mg at night. 4. Klonopin 1 mg at night. 5. Ativan 0.5 mg every 6 hours p.r.n.  6. Aspirin 81 mg a day. 7. Vitamin D2 50,000 every 2 weeks.  8. Vitamin B12 500 mcg once a day orally.  9. Tylenol 325, 2 tablets b.i.d. p.r.n.  10. Senna 1 tablet once a day. 11. Norvasc 5 mg once a day. 12. Metoprolol 25 mg once a day.  13. Norco 5/325 every 6 hours p.r.n.  14. Lisinopril 10 mg a day. 15. Fosamax 70 mg once a week.  16. Ferrous sulfate 325 mg once a day.  17. Calcium with vitamin D b.i.d.  18. Aspirin 81 mg a day.   ALLERGIES: Lyrica, causing altered mental status.  PHYSICAL EXAMINATION:  VITAL SIGNS: Blood pressure 105/62, respiratory rate 19, pulse  62, temperature 96.5, oxygen saturation 95%.   GENERAL APPEARANCE: Elderly male lying in bed, pale-looking, sleepy but arousable. I noticed an episode of sleep apnea.   HEENT: Head: Remarkable for mild pallor. No icterus. No cyanosis. Ears, nose and throat examination: Hearing was normal. Nasal mucosa, lips, and tongue were normal. Eyes: Examination revealed normal iris and conjunctivae. Pupils about 4 to 5 mm, equal and reactive to light.   NECK: Supple. Trachea at  midline. No thyromegaly. No masses.   HEART: Normal S1, S2. No S3, S4. No murmur, no gallop. No carotid bruits.   RESPIRATORY: Examination demonstrated normal breathing pattern without use of accessory muscles. No rales. No wheezing.   ABDOMEN: Soft without tenderness. No hepatosplenomegaly. No masses. No hernias.   SKIN: Examination revealed no ulcers, no subcutaneous nodules. He has +2 peripheral edema at the lower extremities above the ankles.   MUSCULOSKELETAL: No joint swelling. No clubbing.   NEUROLOGIC: Cranial nerves II through XII are intact. No focal motor deficit.   PSYCHIATRIC: On evaluation, the patient is alert, disoriented to place and time. Mood and affect were normal.   LABORATORY, DIAGNOSTIC AND RADIOLOGICAL DATA:  Chest x-ray showed no consolidation, no effusion.  EKG showed normal sinus rhythm at a rate of 66 per minute. Inverted T wave in leads V1 to V3. These are new changes compared to his EKG done a month ago on 03/26/2012.  His serum glucose was 101, BUN 25, creatinine 1.12, sodium 135, potassium 4.0.  His total protein was 7.3, albumin 3.3. AST and ALT were normal.  Troponin is elevated at 1.1. His baseline a month ago was 0.02.  CBC showed a white count of 14,000, hemoglobin 11,  hematocrit 34, platelet count 189.  Urinalysis was unremarkable.   ASSESSMENT:  1. Syncopal episodes associated with reduction in blood pressure. These are self-limited.  2. Elevated troponin consistent with non-ST elevation acute myocardial infarction. The patient needs to be monitored for arrhythmias.  3. Dementia. This appears progressive.  4. Obstructive sleep apnea syndrome.  5. Carotid stenosis.  6. Systemic hypertension.  7. Coronary artery disease.  8. History of transient ischemic attacks.  9. Hyperlipidemia.  10. History of paroxysmal supraventricular tachycardia.  11. History of aortic stenosis.   PLAN: A CAT scan of the head was done, and that revealed no acute  intracranial abnormalities. There is evidence of small vessel ischemic disease and cerebral and cerebellar atrophy. We will perform frequent neurologic examinations. Monitor the patient on telemetry over the next 24 hours. Given his age and the advancement of dementia, we will treat him conservatively. We will monitor the troponin every 8 hours. I will continue aspirin and beta blocker. Due to episodes of hypotension, I will eliminate amlodipine. Since the patient is lethargic, I will also minimize his psychiatric medications. I will discontinue trazodone. I will continue the rest of his home medications.   There is suggestion for dehydration. His BUN/creatinine ratio is elevated compared to his previous values a month ago. His BUN was 14 with a creatinine of 0.7. It is now 25/1.1. Therefore, I will place the patient on gentle IV hydration.   CODE STATUS: The patient's code status is DO NOT RESUSCITATE as evident by a formal DO NOT RESUSCITATE form signed by his primary care physician, Dr. Tillman Abide. The patient does not provide any meaningful input to ask him about whether he has a Living will.        TIME SPENT EVALUATING THIS PATIENT: More than 55  minutes.  ____________________________ Carney [place]ers. Rudene Re, MD amd:cbb D: 04/25/2012 01:37:49 ET T: 04/25/2012 12:24:04 ET JOB#: [id number]  cc: Carney Corners. Rudene Re, MD, <Dictator> Karie Schwalbe, MD Karolee Ohs Dala Dock MD ELECTRONICALLY SIGNED 04/27/2012 22:14
